# Patient Record
Sex: Male | Born: 1960 | Race: Black or African American | Hispanic: No | Marital: Single | Smoking: Current every day smoker
Health system: Southern US, Community
[De-identification: ages and names within clinical notes are randomized; demographics above are authoritative.]

## PROBLEM LIST (undated history)

## (undated) DIAGNOSIS — F1010 Alcohol abuse, uncomplicated: Secondary | ICD-10-CM

## (undated) DIAGNOSIS — F1290 Cannabis use, unspecified, uncomplicated: Secondary | ICD-10-CM

## (undated) DIAGNOSIS — I209 Angina pectoris, unspecified: Secondary | ICD-10-CM

## (undated) HISTORY — PX: ABDOMINAL SURGERY: SHX537

---

## 2011-08-27 ENCOUNTER — Emergency Department (HOSPITAL_COMMUNITY)
Admission: EM | Admit: 2011-08-27 | Discharge: 2011-08-28 | Disposition: A | Discharge status: Other Acute Inpatient Hospital | Payer: Self-pay | Attending: Emergency Medicine | Admitting: Emergency Medicine

## 2011-08-27 DIAGNOSIS — F1010 Alcohol abuse, uncomplicated: Secondary | ICD-10-CM | POA: Insufficient documentation

## 2011-08-27 LAB — COMPREHENSIVE METABOLIC PANEL
ALT: 21 U/L (ref 0–53)
AST: 29 U/L (ref 0–37)
CO2: 28 mEq/L (ref 19–32)
Calcium: 9.4 mg/dL (ref 8.4–10.5)
Chloride: 98 mEq/L (ref 96–112)
Creatinine, Ser: 1.04 mg/dL (ref 0.50–1.35)
GFR calc Af Amer: >60 mL/min (ref >60)
GFR calc non Af Amer: >60 mL/min (ref >60)
Glucose, Bld: 80 mg/dL (ref 70–99)
Sodium: 137 mEq/L (ref 135–145)
Total Bilirubin: 0.4 mg/dL (ref 0.3–1.2)

## 2011-08-27 LAB — CBC
Hemoglobin: 14.9 g/dL (ref 13.0–17.0)
MCH: 31.8 pg (ref 26.0–34.0)
MCV: 90.2 fL (ref 78.0–100.0)
Platelets: 332 10*3/uL (ref 150–400)
RBC: 4.68 MIL/uL (ref 4.22–5.81)
WBC: 8.5 10*3/uL (ref 4.0–10.5)

## 2011-08-27 LAB — DIFFERENTIAL
Eosinophils Absolute: 0.2 10*3/uL (ref 0.0–0.7)
Lymphs Abs: 2.1 10*3/uL (ref 0.7–4.0)
Monocytes Relative: 7 % (ref 3–12)
Neutro Abs: 5.5 10*3/uL (ref 1.7–7.7)
Neutrophils Relative %: 65 % (ref 43–77)

## 2011-08-27 LAB — URINALYSIS, ROUTINE W REFLEX MICROSCOPIC
Bilirubin Urine: NEGATIVE
Ketones, ur: NEGATIVE mg/dL
Leukocytes, UA: NEGATIVE
Nitrite: NEGATIVE
Protein, ur: NEGATIVE mg/dL
Urobilinogen, UA: 0.2 mg/dL (ref 0.0–1.0)

## 2011-08-27 LAB — RAPID URINE DRUG SCREEN, HOSP PERFORMED: Barbiturates: NOT DETECTED

## 2011-10-22 ENCOUNTER — Emergency Department (HOSPITAL_BASED_OUTPATIENT_CLINIC_OR_DEPARTMENT_OTHER)
Admission: EM | Admit: 2011-10-22 | Discharge: 2011-10-22 | Disposition: A | Discharge status: Home / Self Care | Payer: Self-pay | Attending: Emergency Medicine | Admitting: Emergency Medicine

## 2011-10-22 DIAGNOSIS — H60399 Other infective otitis externa, unspecified ear: Secondary | ICD-10-CM | POA: Insufficient documentation

## 2011-10-22 DIAGNOSIS — R21 Rash and other nonspecific skin eruption: Secondary | ICD-10-CM | POA: Insufficient documentation

## 2011-10-22 DIAGNOSIS — H6090 Unspecified otitis externa, unspecified ear: Secondary | ICD-10-CM

## 2011-10-22 DIAGNOSIS — F17200 Nicotine dependence, unspecified, uncomplicated: Secondary | ICD-10-CM | POA: Insufficient documentation

## 2011-10-22 DIAGNOSIS — L259 Unspecified contact dermatitis, unspecified cause: Secondary | ICD-10-CM | POA: Insufficient documentation

## 2011-10-22 MED ORDER — PREDNISONE (PAK) 10 MG PO TABS: 10.0000 mg | ORAL_TABLET | Freq: Every day | ORAL | Status: AC

## 2011-10-22 MED ORDER — NEOMYCIN-POLYMYXIN-HC 3.5-10000-1 OT SOLN
4.0000 [drp] | Freq: Four times a day (QID) | OTIC | Status: DC
  Administered 2011-10-22: 4 [drp] via OTIC
  Filled 2011-10-22: qty 10

## 2011-10-22 MED ORDER — NEOMYCIN-POLYMYXIN-DEXAMETH 3.5-10000-0.1 OP OINT: TOPICAL_OINTMENT | Freq: Four times a day (QID) | OPHTHALMIC | Status: DC

## 2011-10-22 NOTE — ED Notes (Signed)
Pt reports a rash on neck, face and arms.  Onset yesterday and no relief after taking benadryl.

## 2011-10-22 NOTE — ED Provider Notes (Signed)
History     CSN: [id number] Arrival date & time: 10/22/2011  2:03 PM   First MD Initiated Contact with Patient 10/22/11 1406      Chief Complaint  Patient presents with  . Rash    (Consider location/radiation/quality/duration/timing/severity/associated sxs/prior treatment) HPI Comments: Pt states that he thinks it started after he had shrimp:pt states that he had the rash on his elbow previously, but it is worse:pt states that he is also having some pain in his left ear  Patient is a 50 y.o. male presenting with rash. The history is provided by the patient. No language interpreter was used.  Rash  This is a new problem. The current episode started yesterday. The problem has not changed since onset.Associated with: shrimp. There has been no fever. The rash is present on the face, right arm and left arm. The pain is moderate. The pain has been constant since onset. Associated symptoms include itching. Pertinent negatives include no blisters and no pain. He has tried antihistamines for the symptoms. The treatment provided mild relief.    History reviewed. No pertinent past medical history.  Past Surgical History  Procedure Date  . Abdominal surgery     No family history on file.  History  Substance Use Topics  . Smoking status: Current Everyday Smoker -- 1.0 packs/day  . Smokeless tobacco: Not on file  . Alcohol Use: No      Review of Systems  Skin: Positive for itching and rash.  All other systems reviewed and are negative.    Allergies  Review of patient's allergies indicates no known allergies.  Home Medications   Current Outpatient Rx  Name Route Sig Dispense Refill  . DIPHENHYDRAMINE HCL (SLEEP) 25 MG PO TABS Oral Take 25 mg by mouth as needed.      . MULTIVITAMINS PO CAPS Oral Take 1 capsule by mouth daily.        BP 128/69  Pulse 79  Temp(Src) 98.4 F (36.9 C) (Oral)  Resp 16  Ht 5\' 10"  (1.778 m)  Wt 160 lb (72.576 kg)  BMI 22.96 kg/m2  SpO2  100%  Physical Exam  Nursing note and vitals reviewed. Constitutional: He appears well-developed and well-nourished.  HENT:  Head: Normocephalic and atraumatic.  Right Ear: External ear normal.  Ears:  Mouth/Throat: Oropharynx is clear and moist.       No swelling or oral involvement noted  Cardiovascular: Normal rate and regular rhythm.   Pulmonary/Chest: Effort normal and breath sounds normal.  Musculoskeletal: Normal range of motion.  Neurological: He is alert.  Skin:       Pt has a dry scaly rash to bilateral ac:pt has red raised papules to face    ED Course  Procedures (including critical care time)  Labs Reviewed - No data to display No results found.   1. Otitis externa   2. Contact dermatitis       MDM  Will treat with oral steroids:pt has what appears to be eczema in ac and then a contact derm on the face:no concern for steven johnson:will treat for otitis externa as well        Teressa Lower, NP 10/22/11 1428

## 2011-10-23 NOTE — ED Provider Notes (Signed)
Medical screening examination/treatment/procedure(s) were performed by non-physician practitioner and as supervising physician I was immediately available for consultation/collaboration.   Lillard Bailon E Millissa Deese, MD 10/23/11 1449 

## 2012-01-03 ENCOUNTER — Emergency Department (HOSPITAL_BASED_OUTPATIENT_CLINIC_OR_DEPARTMENT_OTHER)
Admission: EM | Admit: 2012-01-03 | Discharge: 2012-01-03 | Disposition: A | Discharge status: Home / Self Care | Payer: Self-pay | Attending: Emergency Medicine | Admitting: Emergency Medicine

## 2012-01-03 ENCOUNTER — Encounter (HOSPITAL_BASED_OUTPATIENT_CLINIC_OR_DEPARTMENT_OTHER): Payer: Self-pay | Admitting: *Deleted

## 2012-01-03 DIAGNOSIS — L259 Unspecified contact dermatitis, unspecified cause: Secondary | ICD-10-CM | POA: Insufficient documentation

## 2012-01-03 DIAGNOSIS — F17200 Nicotine dependence, unspecified, uncomplicated: Secondary | ICD-10-CM | POA: Insufficient documentation

## 2012-01-03 DIAGNOSIS — R21 Rash and other nonspecific skin eruption: Secondary | ICD-10-CM | POA: Insufficient documentation

## 2012-01-03 MED ORDER — DIPHENHYDRAMINE HCL 25 MG PO CAPS
ORAL_CAPSULE | ORAL | Status: AC
  Filled 2012-01-03: qty 1

## 2012-01-03 MED ORDER — DIPHENHYDRAMINE HCL 25 MG PO CAPS
25.0000 mg | ORAL_CAPSULE | Freq: Once | ORAL | Status: AC
  Administered 2012-01-03: 25 mg via ORAL
  Filled 2012-01-03: qty 1

## 2012-01-03 MED ORDER — PREDNISONE 10 MG PO TABS: ORAL_TABLET | ORAL | Status: DC

## 2012-01-03 NOTE — ED Notes (Signed)
Patient states he is using dove sensative skin for his body wash, and is using vaseline on his face for dry skin.  Encouraged patient to quit using the vaseline and to continue to use dove and aveeno lotion.

## 2012-01-03 NOTE — ED Provider Notes (Signed)
History     CSN: [id number]  Arrival date & time 01/03/12  1408   First MD Initiated Contact with Patient 01/03/12 1416      Chief Complaint  Patient presents with  . Rash    (Consider location/radiation/quality/duration/timing/severity/associated sxs/prior treatment) HPI Comments: Pt states that he had similar rash about 2 months ago and it resolved after some steriods, but he doesn't know what it was from last time or this time:pt states that he has been given benadryl at [facility] the last couple of days, but he still has the rash and itchiness  Patient is a 51 y.o. male presenting with rash. The history is provided by the patient. No language interpreter was used.  Rash  This is a new problem. The current episode started more than 2 days ago. The problem is associated with an unknown factor. There has been no fever. The rash is present on the face. Associated symptoms include itching. Pertinent negatives include no blisters, no pain and no weeping. He has tried antihistamines for the symptoms. The treatment provided mild relief.    History reviewed. No pertinent past medical history.  Past Surgical History  Procedure Date  . Abdominal surgery     No family history on file.  History  Substance Use Topics  . Smoking status: Current Everyday Smoker -- 1.0 packs/day  . Smokeless tobacco: Not on file  . Alcohol Use: No      Review of Systems  Skin: Positive for itching and rash.  All other systems reviewed and are negative.    Allergies  Review of patient's allergies indicates no known allergies.  Home Medications   Current Outpatient Rx  Name Route Sig Dispense Refill  . DIPHENHYDRAMINE HCL (SLEEP) 25 MG PO TABS Oral Take 25 mg by mouth as needed.      . MULTIVITAMINS PO CAPS Oral Take 1 capsule by mouth daily.        BP 123/77  Pulse 61  Temp(Src) 99 F (37.2 C) (Oral)  Resp 20  Ht 5' 10.5" (1.791 m)  Wt 160 lb (72.576 kg)  BMI 22.63 kg/m2  SpO2  100%  Physical Exam  Nursing note and vitals reviewed. Constitutional: He is oriented to person, place, and time. He appears well-developed and well-nourished.  HENT:  Right Ear: External ear normal.  Left Ear: External ear normal.  Mouth/Throat: Oropharynx is clear and moist.  Eyes: EOM are normal.  Neck: Neck supple.  Cardiovascular: Normal rate and regular rhythm.   Pulmonary/Chest: Effort normal and breath sounds normal.  Musculoskeletal: Normal range of motion.  Neurological: He is alert and oriented to person, place, and time.  Skin:       Pt has papules noted to face:pt also has dry scaly rash to the bilateral ac space  Psychiatric: He has a normal mood and affect.    ED Course  Procedures (including critical care time)  Labs Reviewed - No data to display No results found.   1. Contact dermatitis       MDM  Pt appears to have a contact dermatitis although uncertain of the cause:pt is not having any respiratory problems:pt has no oral involvement noted   Medical screening examination/treatment/procedure(s) were performed by non-physician practitioner and as supervising physician I was immediately available for consultation/collaboration. Osvaldo Human, M.D.     Teressa Lower, NP 01/03/12 1453  Carleene Cooper III, MD 01/03/12 2019

## 2012-01-03 NOTE — ED Notes (Signed)
Rash on his face 3 days. No resp distress.

## 2014-11-25 ENCOUNTER — Emergency Department (HOSPITAL_COMMUNITY): Payer: Self-pay

## 2014-11-25 ENCOUNTER — Emergency Department (HOSPITAL_COMMUNITY)
Admission: EM | Admit: 2014-11-25 | Discharge: 2014-11-25 | Disposition: A | Discharge status: Home / Self Care | Payer: Self-pay | Attending: Emergency Medicine | Admitting: Emergency Medicine

## 2014-11-25 ENCOUNTER — Encounter (HOSPITAL_COMMUNITY): Payer: Self-pay

## 2014-11-25 DIAGNOSIS — Y9289 Other specified places as the place of occurrence of the external cause: Secondary | ICD-10-CM | POA: Insufficient documentation

## 2014-11-25 DIAGNOSIS — Z79899 Other long term (current) drug therapy: Secondary | ICD-10-CM | POA: Insufficient documentation

## 2014-11-25 DIAGNOSIS — T1490XA Injury, unspecified, initial encounter: Secondary | ICD-10-CM

## 2014-11-25 DIAGNOSIS — IMO0002 Reserved for concepts with insufficient information to code with codable children: Secondary | ICD-10-CM

## 2014-11-25 DIAGNOSIS — Z23 Encounter for immunization: Secondary | ICD-10-CM | POA: Insufficient documentation

## 2014-11-25 DIAGNOSIS — S01112A Laceration without foreign body of left eyelid and periocular area, initial encounter: Secondary | ICD-10-CM | POA: Insufficient documentation

## 2014-11-25 DIAGNOSIS — S0101XA Laceration without foreign body of scalp, initial encounter: Secondary | ICD-10-CM | POA: Insufficient documentation

## 2014-11-25 DIAGNOSIS — Z720 Tobacco use: Secondary | ICD-10-CM | POA: Insufficient documentation

## 2014-11-25 DIAGNOSIS — Y9389 Activity, other specified: Secondary | ICD-10-CM | POA: Insufficient documentation

## 2014-11-25 DIAGNOSIS — Y998 Other external cause status: Secondary | ICD-10-CM | POA: Insufficient documentation

## 2014-11-25 MED ORDER — TETANUS-DIPHTH-ACELL PERTUSSIS 5-2.5-18.5 LF-MCG/0.5 IM SUSP
0.5000 mL | Freq: Once | INTRAMUSCULAR | Status: AC
  Administered 2014-11-25: 0.5 mL via INTRAMUSCULAR
  Filled 2014-11-25: qty 0.5

## 2014-11-25 NOTE — ED Notes (Signed)
Patient transported to CT 
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
## 2014-11-25 NOTE — ED Notes (Signed)
Back in room following CT. 
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
## 2014-12-17 ENCOUNTER — Ambulatory Visit: Payer: Self-pay

## 2016-08-30 ENCOUNTER — Encounter (HOSPITAL_COMMUNITY): Payer: Self-pay | Admitting: *Deleted

## 2016-08-30 ENCOUNTER — Emergency Department (HOSPITAL_COMMUNITY)
Admission: EM | Admit: 2016-08-30 | Discharge: 2016-08-30 | Disposition: A | Discharge status: Home / Self Care | Payer: Self-pay | Attending: Emergency Medicine | Admitting: Emergency Medicine

## 2016-08-30 ENCOUNTER — Emergency Department (HOSPITAL_COMMUNITY): Payer: Self-pay

## 2016-08-30 DIAGNOSIS — G8929 Other chronic pain: Secondary | ICD-10-CM | POA: Insufficient documentation

## 2016-08-30 DIAGNOSIS — M25511 Pain in right shoulder: Secondary | ICD-10-CM | POA: Insufficient documentation

## 2016-08-30 DIAGNOSIS — F17200 Nicotine dependence, unspecified, uncomplicated: Secondary | ICD-10-CM | POA: Insufficient documentation

## 2016-08-30 DIAGNOSIS — R0789 Other chest pain: Secondary | ICD-10-CM | POA: Insufficient documentation

## 2016-08-30 LAB — CBC
HCT: 37.9 % — ABNORMAL LOW (ref 39.0–52.0)
Hemoglobin: 12.4 g/dL — ABNORMAL LOW (ref 13.0–17.0)
MCH: 31.7 pg (ref 26.0–34.0)
MCHC: 32.7 g/dL (ref 30.0–36.0)
MCV: 96.9 fL (ref 78.0–100.0)
PLATELETS: 316 10*3/uL (ref 150–400)
RBC: 3.91 MIL/uL — AB (ref 4.22–5.81)
RDW: 15.5 % (ref 11.5–15.5)
WBC: 5.9 10*3/uL (ref 4.0–10.5)

## 2016-08-30 LAB — COMPREHENSIVE METABOLIC PANEL
ALBUMIN: 3.5 g/dL (ref 3.5–5.0)
ALT: 17 U/L (ref 17–63)
ANION GAP: 5 (ref 5–15)
AST: 27 U/L (ref 15–41)
Alkaline Phosphatase: 63 U/L (ref 38–126)
BILIRUBIN TOTAL: 0.3 mg/dL (ref 0.3–1.2)
BUN: 8 mg/dL (ref 6–20)
CO2: 29 mmol/L (ref 22–32)
Calcium: 8.6 mg/dL — ABNORMAL LOW (ref 8.9–10.3)
Chloride: 108 mmol/L (ref 101–111)
Creatinine, Ser: 0.92 mg/dL (ref 0.61–1.24)
GFR calc non Af Amer: >60 mL/min (ref >60)
GLUCOSE: 83 mg/dL (ref 65–99)
POTASSIUM: 4.3 mmol/L (ref 3.5–5.1)
SODIUM: 142 mmol/L (ref 135–145)
TOTAL PROTEIN: 6.4 g/dL — AB (ref 6.5–8.1)

## 2016-08-30 LAB — RAPID URINE DRUG SCREEN, HOSP PERFORMED
Amphetamines: NOT DETECTED
Barbiturates: NOT DETECTED
Benzodiazepines: NOT DETECTED
COCAINE: POSITIVE — AB
OPIATES: POSITIVE — AB
Tetrahydrocannabinol: NOT DETECTED

## 2016-08-30 LAB — I-STAT TROPONIN, ED
TROPONIN I, POC: 0 ng/mL (ref 0.00–0.08)
TROPONIN I, POC: 0 ng/mL (ref 0.00–0.08)

## 2016-08-30 LAB — LIPASE, BLOOD: LIPASE: 17 U/L (ref 11–51)

## 2016-08-30 MED ORDER — KETOROLAC TROMETHAMINE 30 MG/ML IJ SOLN
30.0000 mg | Freq: Once | INTRAMUSCULAR | Status: AC
  Administered 2016-08-30: 30 mg via INTRAVENOUS
  Filled 2016-08-30: qty 1

## 2016-08-30 MED ORDER — MELOXICAM 7.5 MG PO TABS: 7.5000 mg | ORAL_TABLET | Freq: Every day | ORAL | 0 refills | Status: DC | PRN

## 2016-08-30 MED ORDER — ASPIRIN 81 MG PO CHEW
324.0000 mg | CHEWABLE_TABLET | Freq: Once | ORAL | Status: DC
  Filled 2016-08-30: qty 4

## 2016-08-30 NOTE — ED Notes (Signed)
Informed pt we needed urine, pt unable to go at this time.

## 2016-08-30 NOTE — ED Provider Notes (Signed)
[facility] Provider Note   CSN: [id number] Arrival date & time: 08/30/16  4098     History   Chief Complaint Chief Complaint  Patient presents with  . Shoulder Pain  . Nausea  . Chest Pain    HPI Kenneth Leblanc is a 55 y.o. male.  HPI   Pt presents with sharp left sided chest pain that began yesterday around 8 or 9pm.  The pain is sharp, constant, no exacerbating or palliative symptoms.  Associated nausea.  Denies fevers, URI symptoms, change in chronic smoker's cough, hemoptysis, SOB, abdominal pain, any chest wall injury.  Denies recent immobilization, leg swelling, orthopnea.  Has also noted right arm pain and tingling that has been intermittent for at least one year, unrelated to the left chest pain.  Pt does not have a primary care provider.  Family hx of a brother who had an MI at an unknown age - this brother is currently 31 years old.  Pt does drink alcohol, few beers last night.  Denies drug use.    No past medical history on file.  There are no active problems to display for this patient.   Past Surgical History:  Procedure Laterality Date  . ABDOMINAL SURGERY         Home Medications    Prior to Admission medications   Medication Sig Start Date End Date Taking? Authorizing Provider  diphenhydrAMINE (BENADRYL) 25 MG tablet Take 50 mg by mouth every 6 (six) hours as needed for allergies.   Yes Historical Provider, MD  Multiple Vitamin (MULTIVITAMIN WITH MINERALS) TABS tablet Take 1 tablet by mouth daily.   Yes Historical Provider, MD    Family History No family history on file.  Social History Social History  Substance Use Topics  . Smoking status: Current Every Day Smoker    Packs/day: 1.00  . Smokeless tobacco: Never Used  . Alcohol use No     Allergies   Review of patient's allergies indicates no known allergies.   Review of Systems Review of Systems  All other systems reviewed and are negative.    Physical Exam Updated Vital  Signs BP 131/78   Pulse (!) 59   Temp 97.7 F (36.5 C) (Oral)   Resp 15   Ht 5\' 10"  (1.778 m)   Wt 63.5 kg   SpO2 98%   BMI 20.09 kg/m   Physical Exam  Constitutional: He appears well-developed and well-nourished. No distress.  HENT:  Head: Normocephalic and atraumatic.  Neck: Neck supple.  Cardiovascular: Normal rate and regular rhythm.   Pulmonary/Chest: Effort normal and breath sounds normal. No respiratory distress. He has no wheezes. He has no rales. He exhibits tenderness (left lateral chest wall tenderness ).  Abdominal: Soft. He exhibits no distension and no mass. There is tenderness (mild epigastric tenderness ). There is no rebound and no guarding.  Musculoskeletal:  Right shoulder with no erythema, edema, warmth. No focal tenderness.   No c-spine tenderness.  Distal pulses, sensation intact, grip strength is normal.    Neurological: He is alert. He exhibits normal muscle tone.  Skin: He is not diaphoretic.  Nursing note and vitals reviewed.    ED Treatments / Results  Labs (all labs ordered are listed, but only abnormal results are displayed) Labs Reviewed  CBC - Abnormal; Notable for the following:       Result Value   RBC 3.91 (*)    Hemoglobin 12.4 (*)    HCT 37.9 (*)  All other components within normal limits  COMPREHENSIVE METABOLIC PANEL - Abnormal; Notable for the following:    Calcium 8.6 (*)    Total Protein 6.4 (*)    All other components within normal limits  URINE RAPID DRUG SCREEN, HOSP PERFORMED - Abnormal; Notable for the following:    Opiates POSITIVE (*)    Cocaine POSITIVE (*)    All other components within normal limits  LIPASE, BLOOD  I-STAT TROPOININ, ED  I-STAT TROPOININ, ED    EKG  EKG Interpretation  Date/Time:  Thursday August 30 2016 07:23:58 EDT Ventricular Rate:  63 PR Interval:    QRS Duration: 92 QT Interval:  438 QTC Calculation: 449 R Axis:   82 Text Interpretation:  Sinus rhythm Probable anteroseptal  infarct, old no prior tracing for comparison Confirmed by [facility] Hospital[facility]ARD[facility] MD, PEDRO [phone]([id number]) on 08/30/2016 11:08:37 AM       Radiology Dg Chest 2 View  Result Date: 08/30/2016 CLINICAL DATA:  Chest pain. EXAM: CHEST  2 VIEW COMPARISON:  03/12/1995 report. FINDINGS: Mediastinum hilar structures normal. Cardiomegaly with normal pulmonary vascularity. Tiny calcified pulmonary nodules most likely granulomas. No focal pulmonary infiltrate. No pleural effusion or pneumothorax. IMPRESSION: 1.  Cardiomegaly.  No pulmonary venous congestion. 2. Tiny calcified pulmonary nodules most likely granulomas. No acute pulmonary disease. Electronically Signed   By: Maisie Fushomas  Register   On: 08/30/2016 08:31    Procedures Procedures (including critical care time)  Medications Ordered in ED Medications  aspirin chewable tablet 324 mg (324 mg Oral Not Given 08/30/16 0814)  ketorolac (TORADOL) 30 MG/ML injection 30 mg (30 mg Intravenous Given 08/30/16 1114)     Initial Impression / Assessment and Plan / ED Course  I have reviewed the triage vital signs and the nursing notes.  Pertinent labs & imaging results that were available during my care of the patient were reviewed by me and considered in my medical decision making (see chart for details).  Clinical Course    Afebrile, nontoxic patient with atypical chest pain that occurred last night.  No exacerbating factors or associated symptoms.  Pt also seen by Dr Eudelia Bunchardama.  Clinically doubt ACS.  Pt also has no known risk factor for PE.  Pt completely CP free in ED, complaining mostly of chronic unchanged right shoulder pain.  No e/o septic joint.  D/C home with PCP resources for follow up.  Advised to stop using cocaine.   Discussed result, findings, treatment, and follow up  with patient.  Pt given return precautions.  Pt verbalizes understanding and agrees with plan.       Final Clinical Impressions(s) / ED Diagnoses   Final diagnoses:  Atypical chest pain  Chronic right  shoulder pain    New Prescriptions New Prescriptions   No medications on file     [name], PA-C 08/30/16 1215

## 2016-08-30 NOTE — ED Provider Notes (Signed)
Medical screening examination/treatment/procedure(s) were conducted as a shared visit with non-physician practitioner(s) and myself.  I personally evaluated the patient during the encounter. Briefly, the patient is a 55 y.o. male who presents to the ED with sudden onset of left lateral sharp, stabbing pain that lasted 10 minutes. Has not recurred since onset. Currently asymptomatic. Patient denied any history of illicit drug use however UDS is positive. Presentation is highly inconsistent with ACS. Low pretest probability for pulmonary embolism. EKG nonspecific changes however without evidence of acute ischemia. Given the EKG changes we'll rule out ACS with serial troponins. Chest x-ray with cardiomegaly however without evidence suggestive of pneumonia, pneumothorax, pneumomediastinum.  No abnormal contour of the mediastinum to suggest dissection. No evidence of acute injuries.   Serial troponins negative X2.  The patient is safe for discharge with strict return precautions.    EKG Interpretation  Date/Time:  Thursday August 30 2016 07:23:58 EDT Ventricular Rate:  63 PR Interval:    QRS Duration: 92 QT Interval:  438 QTC Calculation: 449 R Axis:   82 Text Interpretation:  Sinus rhythm Probable anteroseptal infarct, old no prior tracing for comparison Confirmed by [facility]ARD[facility] MD, PEDRO ([id number]) on 08/30/2016 11:08:37 AM           Nira ConnPedro Eduardo Cardama, MD 08/31/16 [phone]

## 2016-08-30 NOTE — ED Notes (Signed)
Patient being transported to x-ray by Tami Ribasudy Suits.

## 2016-08-30 NOTE — Discharge Instructions (Signed)
Read the information below.  You may return to the Emergency Department at any time for worsening condition or any new symptoms that concern you.   If you develop worsening chest pain, shortness of breath, fever, you pass out, or become weak or dizzy, return to the ER for a recheck.    °

## 2016-08-30 NOTE — ED Triage Notes (Signed)
Patient comes in with c/o chest pain, right shoulder pain, and nausea. Patient states the nausea started yesterday. No vomiting. LBM today. Chest pain started this AM when he was sleeping on the left side of chest. Patient states, "something just aint right." Patient states its never felt like this. Nothing makes the pain better or worse. Hx of smoking, 0.5-1 pack/day.

## 2016-09-03 ENCOUNTER — Emergency Department (HOSPITAL_COMMUNITY)
Admission: EM | Admit: 2016-09-03 | Discharge: 2016-09-03 | Disposition: A | Discharge status: Home / Self Care | Payer: Self-pay | Attending: Emergency Medicine | Admitting: Emergency Medicine

## 2016-09-03 ENCOUNTER — Encounter (HOSPITAL_COMMUNITY): Payer: Self-pay | Admitting: Emergency Medicine

## 2016-09-03 DIAGNOSIS — F1020 Alcohol dependence, uncomplicated: Secondary | ICD-10-CM | POA: Insufficient documentation

## 2016-09-03 DIAGNOSIS — F17200 Nicotine dependence, unspecified, uncomplicated: Secondary | ICD-10-CM | POA: Insufficient documentation

## 2016-09-03 LAB — COMPREHENSIVE METABOLIC PANEL
ALT: 31 U/L (ref 17–63)
ANION GAP: 15 (ref 5–15)
AST: 46 U/L — AB (ref 15–41)
Albumin: 4.1 g/dL (ref 3.5–5.0)
Alkaline Phosphatase: 72 U/L (ref 38–126)
BILIRUBIN TOTAL: 0.7 mg/dL (ref 0.3–1.2)
BUN: 7 mg/dL (ref 6–20)
CO2: 23 mmol/L (ref 22–32)
Calcium: 9.4 mg/dL (ref 8.9–10.3)
Chloride: 100 mmol/L — ABNORMAL LOW (ref 101–111)
Creatinine, Ser: 0.83 mg/dL (ref 0.61–1.24)
GFR calc non Af Amer: >60 mL/min (ref >60)
GLUCOSE: 64 mg/dL — AB (ref 65–99)
Potassium: 4.7 mmol/L (ref 3.5–5.1)
Sodium: 138 mmol/L (ref 135–145)
TOTAL PROTEIN: 7.5 g/dL (ref 6.5–8.1)

## 2016-09-03 LAB — CBC
HCT: 41.3 % (ref 39.0–52.0)
HEMOGLOBIN: 13.8 g/dL (ref 13.0–17.0)
MCH: 32.2 pg (ref 26.0–34.0)
MCHC: 33.4 g/dL (ref 30.0–36.0)
MCV: 96.3 fL (ref 78.0–100.0)
Platelets: 360 10*3/uL (ref 150–400)
RBC: 4.29 MIL/uL (ref 4.22–5.81)
RDW: 15.3 % (ref 11.5–15.5)
WBC: 9 10*3/uL (ref 4.0–10.5)

## 2016-09-03 LAB — RAPID URINE DRUG SCREEN, HOSP PERFORMED
AMPHETAMINES: NOT DETECTED
BENZODIAZEPINES: NOT DETECTED
Barbiturates: NOT DETECTED
Cocaine: NOT DETECTED
OPIATES: POSITIVE — AB
TETRAHYDROCANNABINOL: NOT DETECTED

## 2016-09-03 LAB — ETHANOL: Alcohol, Ethyl (B): 164 mg/dL — ABNORMAL HIGH (ref <5)

## 2016-09-03 MED ORDER — ADULT MULTIVITAMIN W/MINERALS CH
1.0000 | ORAL_TABLET | Freq: Every day | ORAL | Status: DC
  Administered 2016-09-03: 1 via ORAL
  Filled 2016-09-03: qty 1

## 2016-09-03 MED ORDER — ONDANSETRON 4 MG PO TBDP: 4.0000 mg | ORAL_TABLET | Freq: Three times a day (TID) | ORAL | 0 refills | Status: DC | PRN

## 2016-09-03 MED ORDER — THIAMINE HCL 100 MG/ML IJ SOLN
100.0000 mg | Freq: Once | INTRAMUSCULAR | Status: AC
  Administered 2016-09-03: 100 mg via INTRAMUSCULAR
  Filled 2016-09-03: qty 2

## 2016-09-03 MED ORDER — CHLORDIAZEPOXIDE HCL 25 MG PO CAPS: 50.0000 mg | ORAL_CAPSULE | Freq: Three times a day (TID) | ORAL | 0 refills | Status: AC | PRN

## 2016-09-03 MED ORDER — VITAMIN B-1 100 MG PO TABS: 100.0000 mg | ORAL_TABLET | Freq: Every day | ORAL | 0 refills | Status: AC

## 2016-09-03 MED ORDER — LOPERAMIDE HCL 2 MG PO CAPS: 2.0000 mg | ORAL_CAPSULE | Freq: Four times a day (QID) | ORAL | 0 refills | Status: DC | PRN

## 2016-09-03 MED ORDER — CHLORDIAZEPOXIDE HCL 5 MG PO CAPS: 25.0000 mg | ORAL_CAPSULE | Freq: Four times a day (QID) | ORAL | Status: DC | PRN

## 2016-09-03 NOTE — ED Triage Notes (Signed)
Pt present to ED for detox from alcohol. Pt reports "drinking multiple "40's" everyday. Pt states here in eD to "get help stopping drinking"

## 2016-09-03 NOTE — ED Notes (Signed)
Patient states last drink was at 0700 states he drank 40oz. States he drinks daily. Patient denies any SI/HI

## 2016-09-03 NOTE — Discharge Instructions (Signed)
Substance Abuse Treatment Programs ° °Intensive Outpatient Programs °[facility]     °[address]      °[place], [place]                   °[phone]      ° °[facility] °[address] °[place], [place] °[phone] ° °[facility]     °(Inpatient and outpatient)     °[address]           °[phone]   ° °[facility] °[phone] (Suboxone and Methadone) ° °[address]      °[place], [place] [address]      °[phone]      ° °[address] °[place], [place] °[phone] ° °[facility] (Outpatient/Inpatient, Chemical)    °(insurance only) [phone]      °       °[facility]Groups & Residential) °[place], [place] °[phone] ° °   °[facility]     °[address]     °[place], [place]      °[phone]      ° °[facility] (for caregivers and family) °[address] °[place], [place] °336-299-4655 ° ° ° ° ° °Residential Treatment Programs °[facility]      °[address], [place], [place] [address]  °[phone]      ° °[facility] °[address] [place], [place] [address] °[phone] ° °[facility]        °336-248-8914      ° °[facility] °[phone] ° °[facility]             °[address]                                         °[place], [place]                                                °877-615-2722 or 336-784-9470                              ° °[facility] [place] °[address] °[place], [address] °[phone] ° °[facility]    °[address]      °[place], [place]     °[phone]      ° °[facility] °[address] °[place], [place] °336-285-9073 ° °[facility]   °[address]     °[place], [place] [address]     °[phone]      °Admissions: 8am-3pm M-F ° °[facility] °[address] °[place],  [place] °336-227-7417 ° °[facility]: Residential Program (90 Days)   °[place], [place]      °336-725-8389 or 800-758-6077    ° °[facility] °[place], [place] °(Walk in Hours over the weekend or by referral) ° °[facility] °[address], [place],  [address] °([phone] ° °Crisis Mobile: [facility]:  1-877-626-1772 (for crisis response 24 hours a day) °[facility] Hotline:      1-800-256-2452 °Outpatient Psychiatry and Counseling ° °[facility]: Mobile Crisis   Management 24 hours:  1-877-626-1772 ° °[facility] sliding scale fee and walk in schedule: M-F 8am-12pm/1pm-3pm °[address]  °[place], [place] [address] °[phone] ° °[facility] °[address] °[place], [place] [address] °336-703-9650 ° °[facility] ([facility])- new patient walk-in appointments available Monday - Friday 8am -3pm.          °[address] °[place], [place] [address] °336-676-6840 or crisis line- 336-676-6905 ° °[facility] Intensive Outpatient Therapy Program °[address] °[place], [place] [address] °336-832-9804 ° °[facility]                  °Crisis Services      °[phone]      °[address]     °[place], [place] [address]                ° °[facility]   °[facility] °[phone] °[address] °[place], [place] [address] ° ° °[facility]          °[address]  °[place], [place] [address]       °[phone] ° °[facility] °[address] °[place], [place] [address] °336-292-1510 ° °[facility]    °[address]    °[place], [place] [address]     °[phone]      ° °Parish McKinney, MD     °[address]     °[place] [address]     °336-282-1251     °  °[facility] °[address] °[place] [address] ° °[facility]     °[address]     °[place], [place]      °336-542-2076      ° °[facility] °Shamsher Ahluwalia, MD °[address] °[place], [place] [address] °[phone] ° °[facility]     °[address]     °[place], [place] [address]     °336-274-1237      ° °[facility] °[address] °[place], [place] [address] °336-854-4450 °Resources for Temporary Residential Assistance/Crisis Centers ° °DAY CENTERS °[facility] °M-F 8am-3pm   °[address] [place], [place] [address]   336-332-0824 °Services include: laundry, barbering, support groups, case management, phone  & computer access, showers, AA/NA mtgs, mental health/substance abuse nurse, job skills class, disability information, VA assistance, spiritual classes, etc.  ° °HOMELESS SHELTERS ° °[facility]     °[facility]   °[address], [place]     °[phone]       °       °[facility] (women and children)       °[address] °[place], [place] [address] °336-275-0820 °[email or web address] for application and process °Application Required ° °[facility]   °[address]    °[place] [address]     °[phone]       °             °[facility] °[address] °[place], [place] [address] °336.273.5572 °336-235-0363(schedule application appt.) °Application Required ° °[facility] (women only)    °[address]     °[place], [place] [address]     °336-884-1039      °  Intake starts 6pm daily °Need valid ID, SSC, & Police report °[facility] [place] °[address] °[place], [place] °336-881-5420 °Application Required ° °[facility] (men only)     °[address]      °[place], [place]     °[phone]      ° °[facility] °(Pregnant women only) °[address] °[place], [place] °336-275-0206 ° °[facility]      °[address]      °[place], [place] [address]     °[phone]      °       °[facility] °[address] °[place], [place] °336-723-1848 °90 day commitment/SA/Application process ° °[facility](men only)     °[address]     °[place], [place]     °[phone]       °Check-in at 7pm     °       °[facility] °[address] °[place], [place] [address] °336-248-6684 °Men/Women/Women and Children must be there by 7 pm ° °[facility] °[place], [place] °336-722-8721                ° °

## 2016-09-03 NOTE — ED Provider Notes (Signed)
[facility] Provider Note   CSN: [id number] Arrival date & time: 09/03/16  1009    History   Chief Complaint Chief Complaint  Patient presents with  . Alcohol Problem    HPI Kenneth Leblanc is a 55 y.o. male.  HPI   Pt is a 55 y/o AAM, presents to the ER with desire to be admitted for alcohol detox program.  Pt states that daily he drinks alcohol, he is unable to specify amount, but states that he "drinks until I fall asleep."  This morning he was drinking a 40, and he threw it away and desired to stop drinking.  He began this am at 57m, nearly 9 hours ago.  He has hx of tobacco use, he denies any other illegal drugs. He denies any confusion, tremor, chills, sweats, nausea, vomiting, diarrhea, abdominal pain, chest pain or shortness of breath. He has had no syncopal episodes, falls or injury.  He reports being in a detox or inpatient treatment 5-6 years ago. He currently denies SI, HI, AVH.    History reviewed. No pertinent past medical history.  There are no active problems to display for this patient.   Past Surgical History:  Procedure Laterality Date  . ABDOMINAL SURGERY         Home Medications    Prior to Admission medications   Medication Sig Start Date End Date Taking? Authorizing Provider  diphenhydrAMINE (BENADRYL) 25 MG tablet Take 50 mg by mouth every 6 (six) hours as needed for allergies.   Yes Historical Provider, MD  Multiple Vitamin (MULTIVITAMIN WITH MINERALS) TABS tablet Take 1 tablet by mouth daily.   Yes Historical Provider, MD  chlordiazePOXIDE (LIBRIUM) 25 MG capsule Take 2 capsules (50 mg total) by mouth 3 (three) times daily as needed for anxiety. 50mg  by mouth q 6 h day 1 50mg  by mouth q 8 h day 2 50mg  by mouth q 12 h day 3 50mg  by mough QHS day 4 09/03/16   Danelle Berry, PA-C  loperamide (IMODIUM) 2 MG capsule Take 1 capsule (2 mg total) by mouth 4 (four) times daily as needed for diarrhea or loose stools. 09/03/16   Danelle Berry, PA-C    meloxicam (MOBIC) 7.5 MG tablet Take 1 tablet (7.5 mg total) by mouth daily as needed for pain. 08/30/16   Trixie Dredge, PA-C  ondansetron (ZOFRAN ODT) 4 MG disintegrating tablet Take 1 tablet (4 mg total) by mouth every 8 (eight) hours as needed for nausea or vomiting. 09/03/16   Danelle Berry, PA-C  thiamine (VITAMIN B-1) 100 MG tablet Take 1 tablet (100 mg total) by mouth daily. 09/03/16   Danelle Berry, PA-C    Family History No family history on file.  Social History Social History  Substance Use Topics  . Smoking status: Current Every Day Smoker    Packs/day: 1.00  . Smokeless tobacco: Never Used  . Alcohol use No     Allergies   Review of patient's allergies indicates no known allergies.   Review of Systems Review of Systems  All other systems reviewed and are negative.    Physical Exam Updated Vital Signs BP 128/80   Pulse (!) 59   Temp 97.9 F (36.6 C) (Oral)   Resp 17   Ht 5\' 10"  (1.778 m)   Wt 62.1 kg   SpO2 98%   BMI 19.66 kg/m   Physical Exam  Constitutional: He is oriented to person, place, and time. He appears well-developed and well-nourished. No distress.  HENT:  Head: Normocephalic and atraumatic.  Right Ear: External ear normal.  Left Ear: External ear normal.  Nose: Nose normal.  Mouth/Throat: Oropharynx is clear and moist. No oropharyngeal exudate.  Eyes: Conjunctivae and EOM are normal. Pupils are equal, round, and reactive to light. Right eye exhibits no discharge. Left eye exhibits no discharge. No scleral icterus.  Neck: Normal range of motion. Neck supple. No JVD present. No tracheal deviation present.  Cardiovascular: Normal rate, regular rhythm, normal heart sounds and intact distal pulses.  Exam reveals no gallop and no friction rub.   No murmur heard. Regular rate and rhythm, no murmur, gallop or rub, no lower extremity edema, symmetrical peripheral pulses, radial 2+, dorsal pedis 2+  Pulmonary/Chest: Effort normal and breath sounds normal.  No stridor. No respiratory distress. He has no wheezes. He has no rales. He exhibits no tenderness.  Abdominal: Soft. Bowel sounds are normal. He exhibits no distension and no mass. There is no tenderness. There is no rebound and no guarding.  Thin abdomen, normal appearance, nondistended, nontender, bowel sounds 4, no ascites, no tenderness to palpation  Musculoskeletal: Normal range of motion. He exhibits no edema.  Neurological: He is alert and oriented to person, place, and time. He exhibits normal muscle tone. Coordination normal.  Skin: Skin is warm and dry. Capillary refill takes less than 2 seconds. No rash noted. He is not diaphoretic. No erythema. No pallor.  Psychiatric: He has a normal mood and affect. His behavior is normal. Judgment and thought content normal.  Nursing note and vitals reviewed.    ED Treatments / Results  Labs (all labs ordered are listed, but only abnormal results are displayed) Labs Reviewed  COMPREHENSIVE METABOLIC PANEL - Abnormal; Notable for the following:       Result Value   Chloride 100 (*)    Glucose, Bld 64 (*)    AST 46 (*)    All other components within normal limits  ETHANOL - Abnormal; Notable for the following:    Alcohol, Ethyl (B) 164 (*)    All other components within normal limits  URINE RAPID DRUG SCREEN, HOSP PERFORMED - Abnormal; Notable for the following:    Opiates POSITIVE (*)    All other components within normal limits  CBC  AMMONIA  ACETAMINOPHEN LEVEL    EKG  EKG Interpretation None       Radiology No results found.  Procedures Procedures (including critical care time)  Medications Ordered in ED Medications  multivitamin with minerals tablet 1 tablet (1 tablet Oral Given 09/03/16 1549)  chlordiazePOXIDE (LIBRIUM) capsule 25 mg (not administered)  thiamine (B-1) injection 100 mg (100 mg Intramuscular Given 09/03/16 1549)     Initial Impression / Assessment and Plan / ED Course  I have reviewed the triage  vital signs and the nursing notes.  Pertinent labs & imaging results that were available during my care of the patient were reviewed by me and considered in my medical decision making (see chart for details).  Clinical Course   Pt requesting alcohol withdrawal, was drinking this am, wishes to stop.  Pt does not appear to be in withdrawal, speech clear, no tremor, A&O x4.  Pt hemodynamically stable.  Given resources for outpatient tx. Vitals:   09/03/16 1515 09/03/16 1545 09/03/16 1600 09/03/16 1615  BP: 119/65 123/76 123/82 128/80  Pulse: 70 (!) 58 60 (!) 59  Resp: 17 16 16 17   Temp:      TempSrc:      SpO2: 96%  98% 98% 98%  Weight:      Height:         Final Clinical Impressions(s) / ED Diagnoses   Final diagnoses:  Uncomplicated alcohol dependence (HCC)    New Prescriptions Discharge Medication List as of 09/03/2016  4:05 PM    START taking these medications   Details  chlordiazePOXIDE (LIBRIUM) 25 MG capsule Take 2 capsules (50 mg total) by mouth 3 (three) times daily as needed for anxiety. 50mg  by mouth q 6 h day 1 50mg  by mouth q 8 h day 2 50mg  by mouth q 12 h day 3 50mg  by mough QHS day 4, Starting Mon 09/03/2016, Print    thiamine (VITAMIN B-1) 100 MG tablet Take 1 tablet (100 mg total) by mouth daily., Starting Mon 09/03/2016, Print         Danelle Berry, PA-C 09/19/16 1610    Margarita Grizzle, MD 09/23/16 [phone]

## 2016-10-25 ENCOUNTER — Emergency Department (HOSPITAL_COMMUNITY): Payer: Self-pay

## 2016-10-25 ENCOUNTER — Encounter (HOSPITAL_COMMUNITY): Payer: Self-pay | Admitting: Emergency Medicine

## 2016-10-25 ENCOUNTER — Inpatient Hospital Stay (HOSPITAL_COMMUNITY)
Admission: EM | Admit: 2016-10-25 | Discharge: 2016-10-26 | DRG: 918 | Disposition: A | Discharge status: Home / Self Care | Payer: Self-pay | Attending: Internal Medicine | Admitting: Internal Medicine

## 2016-10-25 DIAGNOSIS — I200 Unstable angina: Secondary | ICD-10-CM

## 2016-10-25 DIAGNOSIS — R079 Chest pain, unspecified: Secondary | ICD-10-CM

## 2016-10-25 DIAGNOSIS — I201 Angina pectoris with documented spasm: Secondary | ICD-10-CM | POA: Diagnosis present

## 2016-10-25 DIAGNOSIS — I249 Acute ischemic heart disease, unspecified: Secondary | ICD-10-CM

## 2016-10-25 DIAGNOSIS — Z801 Family history of malignant neoplasm of trachea, bronchus and lung: Secondary | ICD-10-CM

## 2016-10-25 DIAGNOSIS — T405X1A Poisoning by cocaine, accidental (unintentional), initial encounter: Principal | ICD-10-CM | POA: Diagnosis present

## 2016-10-25 DIAGNOSIS — F1290 Cannabis use, unspecified, uncomplicated: Secondary | ICD-10-CM | POA: Diagnosis present

## 2016-10-25 DIAGNOSIS — Z720 Tobacco use: Secondary | ICD-10-CM

## 2016-10-25 DIAGNOSIS — F17210 Nicotine dependence, cigarettes, uncomplicated: Secondary | ICD-10-CM | POA: Diagnosis present

## 2016-10-25 HISTORY — DX: Angina pectoris, unspecified: I20.9

## 2016-10-25 LAB — PROTIME-INR
INR: 0.97
Prothrombin Time: 12.8 seconds (ref 11.4–15.2)

## 2016-10-25 LAB — LIPID PANEL
CHOL/HDL RATIO: 1.9 ratio
CHOLESTEROL: 216 mg/dL — AB (ref 0–200)
HDL: 112 mg/dL (ref >40)
LDL Cholesterol: 91 mg/dL (ref 0–99)
TRIGLYCERIDES: 67 mg/dL (ref <150)
VLDL: 13 mg/dL (ref 0–40)

## 2016-10-25 LAB — COMPREHENSIVE METABOLIC PANEL
ALT: 39 U/L (ref 17–63)
AST: 81 U/L — ABNORMAL HIGH (ref 15–41)
Albumin: 4.1 g/dL (ref 3.5–5.0)
Alkaline Phosphatase: 67 U/L (ref 38–126)
Anion gap: 12 (ref 5–15)
BUN: 20 mg/dL (ref 6–20)
CHLORIDE: 102 mmol/L (ref 101–111)
CO2: 23 mmol/L (ref 22–32)
Calcium: 9.4 mg/dL (ref 8.9–10.3)
Creatinine, Ser: 1.07 mg/dL (ref 0.61–1.24)
Glucose, Bld: 91 mg/dL (ref 65–99)
POTASSIUM: 4.3 mmol/L (ref 3.5–5.1)
SODIUM: 137 mmol/L (ref 135–145)
Total Bilirubin: 0.4 mg/dL (ref 0.3–1.2)
Total Protein: 7.4 g/dL (ref 6.5–8.1)

## 2016-10-25 LAB — I-STAT TROPONIN, ED: Troponin i, poc: 0 ng/mL (ref 0.00–0.08)

## 2016-10-25 LAB — CBC
HEMATOCRIT: 39.8 % (ref 39.0–52.0)
HEMOGLOBIN: 13.4 g/dL (ref 13.0–17.0)
MCH: 32.1 pg (ref 26.0–34.0)
MCHC: 33.7 g/dL (ref 30.0–36.0)
MCV: 95.2 fL (ref 78.0–100.0)
Platelets: 311 10*3/uL (ref 150–400)
RBC: 4.18 MIL/uL — AB (ref 4.22–5.81)
RDW: 15 % (ref 11.5–15.5)
WBC: 10 10*3/uL (ref 4.0–10.5)

## 2016-10-25 LAB — TROPONIN I: Troponin I: <0.03 ng/mL (ref <0.03)

## 2016-10-25 LAB — APTT: APTT: 23 s — AB (ref 24–36)

## 2016-10-25 MED ORDER — SODIUM CHLORIDE 0.9 % IV SOLN
10.0000 mL/h | INTRAVENOUS | Status: DC
  Administered 2016-10-25: 10 mL/h via INTRAVENOUS

## 2016-10-25 MED ORDER — NITROGLYCERIN PEDIATRIC IV INFUSION 200 MCG/ML: 5.0000 ug/min | INTRAVENOUS | Status: DC

## 2016-10-25 MED ORDER — HEPARIN SODIUM (PORCINE) 5000 UNIT/ML IJ SOLN
INTRAMUSCULAR | Status: AC
  Filled 2016-10-25: qty 1

## 2016-10-25 MED ORDER — HEPARIN BOLUS VIA INFUSION
3500.0000 [IU] | Freq: Once | INTRAVENOUS | Status: AC
  Administered 2016-10-25: 3500 [IU] via INTRAVENOUS
  Filled 2016-10-25: qty 3500

## 2016-10-25 MED ORDER — ASPIRIN 81 MG PO CHEW
324.0000 mg | CHEWABLE_TABLET | Freq: Once | ORAL | Status: AC
  Administered 2016-10-25: 324 mg via ORAL

## 2016-10-25 MED ORDER — HEPARIN (PORCINE) IN NACL 100-0.45 UNIT/ML-% IJ SOLN
850.0000 [IU]/h | INTRAMUSCULAR | Status: DC
  Administered 2016-10-25: 750 [IU]/h via INTRAVENOUS
  Filled 2016-10-25: qty 250

## 2016-10-25 MED ORDER — NITROGLYCERIN IN D5W 200-5 MCG/ML-% IV SOLN: 0.0000 ug/min | INTRAVENOUS | Status: DC

## 2016-10-25 MED ORDER — ASPIRIN 81 MG PO CHEW
CHEWABLE_TABLET | ORAL | Status: AC
  Filled 2016-10-25: qty 1

## 2016-10-25 NOTE — H&P (Addendum)
CARDIOLOGY H&P  PCP: none Cardiology: New   HPI:  55 y/o male with no known PMHx except for tobacco use presents to ER with CP and ST elevation.   Denies any previous cardiac history. Says for past few weeks has had 3-4 episodes of brief sharp left-sided CP come on without warning.Lasts 30 seconds an resolves. Tonight went to smoke a cigarette and had recurrent pain without associated symptoms.   Initial ECG in ER with 8mm ST elevatino in v3 and about 3mm ST elevation in V4. Pain quickly resolved without intervention and repeat ECG completely normal.   Adamantly denies cocaine use but UDS in computer from 9/17 + for cocaine. Does admit to occasional THC.   POC trop negative   Review of Systems:     Cardiac Review of Systems: {Y] = yes [ ]  = no  Chest Pain [place].Etienne    ]  Resting SOB [   ] Exertional SOB  [  ]  Orthopnea [  ]   Pedal Edema [   ]    Palpitations [  ] Syncope  [  ]   Presyncope [   ]  General Review of Systems: [Y] = yes [  ]=no Constitional: recent weight change [  ]; anorexia [  ]; fatigue [  ]; nausea [  ]; night sweats [  ]; fever [  ]; or chills [  ];                                                                     Dental: poor dentition[  ];   Eye : blurred vision [  ]; diplopia [   ]; vision changes [  ];  Amaurosis fugax[  ]; Resp: cough [  ];  wheezing[  ];  hemoptysis[  ]; shortness of breath[  ]; paroxysmal nocturnal dyspnea[  ]; dyspnea on exertion[  ]; or orthopnea[  ];  GI:  gallstones[  ], vomiting[  ];  dysphagia[  ]; melena[  ];  hematochezia [  ]; heartburn[  ];   GU: kidney stones [  ]; hematuria[  ];   dysuria [  ];  nocturia[  ];               Skin: rash [  ], swelling[  ];, hair loss[  ];  peripheral edema[  ];  or itching[  ]; Musculosketetal: myalgias[  ];  joint swelling[  ];  joint erythema[  ];  joint pain[  ];  back pain[  ];  Heme/Lymph: bruising[  ];  bleeding[  ];  anemia[  ];  Neuro: TIA[  ];  headaches[  ];  stroke[  ];  vertigo[  ];   seizures[  ];   paresthesias[  ];  difficulty walking[  ];  Psych:depression[  ]; anxiety[  ];  Endocrine: diabetes[  ];  thyroid dysfunction[  ];  Other:  PMHX:  1. Tobacco use 1ppd x many years   Home meds: None   No Known Allergies  Social History   Social History  . Marital status: Single    Spouse name: N/A  . Number of children: N/A  . Years of education: N/A   Occupational History  . Not on  file.   Social History Main Topics  . Smoking status: Current Every Day Smoker    Packs/day: 1.00  . Smokeless tobacco: Never Used  . Alcohol use 3.6 oz/week    6 Cans of beer per week     Comment: current every day drinker  . Drug use: No  . Sexual activity: Not on file   Other Topics Concern  . Not on file   Social History Narrative  . 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 + tobacco use  Family Hx:  Mom alive and well Dad died of lung CA Brother died of MI in his 4850s  PHYSICAL EXAM: Vitals:   10/25/16 2056 10/25/16 2100  BP: 127/79 124/78  Pulse: 81   Resp: 10 13  Temp:     General:  Thin Well appearing. No respiratory difficulty HEENT: normal x for poor dentition Neck: supple. no JVD. Carotids 2+ bilat; no bruits. No lymphadenopathy or thryomegaly appreciated. Cor: PMI nondisplaced. Regular rate & rhythm. No rubs, gallops or murmurs. Lungs: clear Abdomen: soft, nontender, nondistended. No hepatosplenomegaly. No bruits or masses. Good bowel sounds. Extremities: no cyanosis, clubbing, rash, edema Neuro: alert & oriented x 3, cranial nerves grossly intact. moves all 4 extremities w/o difficulty. Affect pleasant.  ECG: as per HPI  Results for orders placed or performed during the hospital encounter of 10/25/16 (from the past 24 hour(s))  CBC     Status: Abnormal   Collection Time: 10/25/16  8:46 PM  Result Value Ref Range   WBC 10.0 4.0 - 10.5 K/uL   RBC 4.18 (L) 4.22 - 5.81 MIL/uL   Hemoglobin 13.4 13.0 - 17.0 g/dL   HCT  40.939.8 81.139.0 - 91.452.0 %   MCV 95.2 78.0 - 100.0 fL   MCH 32.1 26.0 - 34.0 pg   MCHC 33.7 30.0 - 36.0 g/dL   RDW 78.215.0 95.611.5 - 21.315.5 %   Platelets 311 150 - 400 K/uL  Protime-INR     Status: None   Collection Time: 10/25/16  8:46 PM  Result Value Ref Range   Prothrombin Time 12.8 11.4 - 15.2 seconds   INR 0.97   APTT     Status: Abnormal   Collection Time: 10/25/16  8:46 PM  Result Value Ref Range   aPTT 23 (L) 24 - 36 seconds  I-stat troponin, ED     Status: None   Collection Time: 10/25/16  8:53 PM  Result Value Ref Range   Troponin i, poc 0.00 0.00 - 0.08 ng/mL   Comment 3           Dg Chest Port 1 View  Result Date: 10/25/2016 CLINICAL DATA:  Severe left-sided chest pain tonight while smoking cigarettes. Current smoker. EXAM: PORTABLE CHEST 1 VIEW COMPARISON:  08/30/2016 FINDINGS: The heart size and mediastinal contours are within normal limits. Both lungs are clear. The visualized skeletal structures are unremarkable. IMPRESSION: No active disease. Electronically Signed   By: Burman NievesWilliam  Stevens M.D.   On: 10/25/2016 21:20     ASSESSMENT/PLAN: 1. CP/ACS with rapidly resolving ST elevation anteriorly    -aborted STEMI vs spasm    -case discussed with on-cal interventionalist, Dr. Allyson SabalBerry. Given rapid resolution of ST segments, normal troponin and transient nature of symptoms. Code Stemi called off    --treat with ASA, heparin, IV NTG. Cycle CEs    --plan cath in am    --avoid b-blocker with potential of spasm    --check UDS  2. Tobacco use    -counseled on need to quit.   Celinda Dethlefs,MD 9:40 PM

## 2016-10-25 NOTE — ED Provider Notes (Signed)
[facility]-EMERGENCY DEPT Provider Note   CSN: [id number] Arrival date & time: 10/25/16  2031     History   Chief Complaint Chief Complaint  Patient presents with  . Chest Pain    HPI Kenneth Leblanc is a 55 y.o. male.   Chest Pain   This is a new problem. The current episode started less than 1 hour ago. The problem occurs rarely. The problem has been resolved. The pain is associated with rest. The pain is present in the lateral region. The pain is moderate. The quality of the pain is described as brief and stabbing. The pain does not radiate. Pertinent negatives include no abdominal pain, no back pain, no cough, no diaphoresis, no fever, no nausea, no palpitations, no shortness of breath and no vomiting. He has tried nothing for the symptoms. The treatment provided significant relief. Risk factors include alcohol intake, smoking/tobacco exposure and substance abuse.  Pertinent negatives for past medical history include no seizures.    History reviewed. No pertinent past medical history.  There are no active problems to display for this patient.   Past Surgical History:  Procedure Laterality Date  . ABDOMINAL SURGERY         Home Medications    Prior to Admission medications   Medication Sig Start Date End Date Taking? Authorizing Provider  chlordiazePOXIDE (LIBRIUM) 25 MG capsule Take 2 capsules (50 mg total) by mouth 3 (three) times daily as needed for anxiety. 50mg  by mouth q 6 h day 1 50mg  by mouth q 8 h day 2 50mg  by mouth q 12 h day 3 50mg  by mough QHS day 4 09/03/16   Danelle BerryLeisa Tapia, PA-C  diphenhydrAMINE (BENADRYL) 25 MG tablet Take 50 mg by mouth every 6 (six) hours as needed for allergies.    Historical Provider, MD  loperamide (IMODIUM) 2 MG capsule Take 1 capsule (2 mg total) by mouth 4 (four) times daily as needed for diarrhea or loose stools. 09/03/16   Danelle BerryLeisa Tapia, PA-C  meloxicam (MOBIC) 7.5 MG tablet Take 1 tablet (7.5 mg total) by mouth daily as needed for pain.  08/30/16   Trixie DredgeEmily West, PA-C  Multiple Vitamin (MULTIVITAMIN WITH MINERALS) TABS tablet Take 1 tablet by mouth daily.    Historical Provider, MD  ondansetron (ZOFRAN ODT) 4 MG disintegrating tablet Take 1 tablet (4 mg total) by mouth every 8 (eight) hours as needed for nausea or vomiting. 09/03/16   Danelle BerryLeisa Tapia, PA-C  thiamine (VITAMIN B-1) 100 MG tablet Take 1 tablet (100 mg total) by mouth daily. 09/03/16   Danelle BerryLeisa Tapia, PA-C    Family History History reviewed. No pertinent family history.  Social History Social History  Substance Use Topics  . Smoking status: Current Every Day Smoker    Packs/day: 1.00  . Smokeless tobacco: Never Used  . Alcohol use 3.6 oz/week    6 Cans of beer per week     Comment: current every day drinker     Allergies   Patient has no known allergies.   Review of Systems Review of Systems  Constitutional: Negative for chills, diaphoresis and fever.  HENT: Negative for ear pain and sore throat.   Eyes: Negative for pain and visual disturbance.  Respiratory: Negative for cough and shortness of breath.   Cardiovascular: Positive for chest pain. Negative for palpitations.  Gastrointestinal: Negative for abdominal pain, nausea and vomiting.  Genitourinary: Negative for dysuria and hematuria.  Musculoskeletal: Negative for arthralgias and back pain.  Skin: Negative for color change  and rash.  Neurological: Negative for seizures and syncope.  All other systems reviewed and are negative.    Physical Exam Updated Vital Signs BP 121/80   Pulse 88   Temp 98.4 F (36.9 C) (Oral)   Resp 14   Ht 5\' 10"  (1.778 m)   Wt 63.5 kg   SpO2 99%   BMI 20.09 kg/m   Physical Exam  Constitutional: He appears well-developed and well-nourished.  HENT:  Head: Normocephalic and atraumatic.  Eyes: Conjunctivae are normal.  Neck: Neck supple.  Cardiovascular: Normal rate, regular rhythm and normal heart sounds.  Exam reveals no gallop and no friction rub.   No murmur  heard. Pulmonary/Chest: Effort normal and breath sounds normal. No respiratory distress. He has no wheezes. He has no rales.  Abdominal: Soft. He exhibits no distension and no mass. There is no tenderness. There is no guarding.  Musculoskeletal: He exhibits no edema.  Neurological: He is alert.  Skin: Skin is warm and dry. Capillary refill takes less than 2 seconds.  Psychiatric: He has a normal mood and affect.  Nursing note and vitals reviewed.    ED Treatments / Results  Labs (all labs ordered are listed, but only abnormal results are displayed) Labs Reviewed  CBC  PROTIME-INR  APTT  COMPREHENSIVE METABOLIC PANEL  LIPID PANEL  TROPONIN I  I-STAT TROPOININ, ED    EKG  EKG Interpretation  Date/Time:  Thursday October 25 2016 20:46:51 EST Ventricular Rate:  90 PR Interval:  142 QRS Duration: 96 QT Interval:  364 QTC Calculation: 445 R Axis:   75 Text Interpretation:  Normal sinus rhythm Biatrial enlargement Left ventricular hypertrophy ST elevation consider anterolateral injury or acute infarct ** ** ACUTE MI / STEMI ** ** Abnormal ECG Confirmed by KNOTT MD, Reuel Boom ([id number]) on 10/25/2016 8:49:40 PM       Radiology No results found.  Procedures Procedures (including critical care time)  Medications Ordered in ED Medications  0.9 %  sodium chloride infusion (not administered)  aspirin chewable tablet 324 mg (324 mg Oral Given 10/25/16 2056)     Initial Impression / Assessment and Plan / ED Course  I have reviewed the triage vital signs and the nursing notes.  Pertinent labs & imaging results that were available during my care of the patient were reviewed by me and considered in my medical decision making (see chart for details).  Clinical Course    This is a 55 year old gentleman comes to [place] with a brief episode of sharp left-sided chest pain. Never had this pain before. No shortness of breath no nausea no palpitations no diaphoresis associated. No treatment  was given and symptoms completely resolved. At triage he had ST elevations in V3 V4. Concerns for acute STEMI. He is brought back urgently. But without any chest pain. Aspirin is given. Blood pressure stable vital signs stable he is afebrile. Heart sounds normal lungs are clear. Repeat EKG at that time shows normal sinus rhythm without any signs of ischemia. Chest x-ray is unremarkable. Initial troponin is negative. Patient will be admitted to cardiology. Likely cath tomorrow. Heparin is given. Vital signs stable time and off of care. Further minute this patient's care please see inpatient team. Of note the patient was asked about illicit substance use and he denies any cocaine use currently however he does have a history of it. UDS is sent.  Final Clinical Impressions(s) / ED Diagnoses   Final diagnoses:  Chest pain    New Prescriptions New Prescriptions  No medications on file     Cherlynn PerchesEric Audric Venn, MD 10/25/16 2356    Lyndal Pulleyaniel Knott, MD 10/26/16 [phone]

## 2016-10-25 NOTE — ED Triage Notes (Signed)
Pt presents to ED after developing left sided CP after smoking a cigarette approx 10 minutes ago.  Pt denies any other associated symptoms.

## 2016-10-25 NOTE — ED Notes (Signed)
Report given to Judy.

## 2016-10-25 NOTE — ED Notes (Signed)
Pt given ice water and instructed to provide urine sample, urinal at bedside and within reach

## 2016-10-25 NOTE — Progress Notes (Signed)
ANTICOAGULATION CONSULT NOTE  Pharmacy Consult for heparin Indication: chest pain/ACS  Heparin Dosing Weight: 63.5kg   Assessment: 55 yom with CP on admit. Pharmacy consulted to dose heparin for ACS. Not on anticoagulation PTA. No CBC yet, no bleed documented.  Goal of Therapy:  Heparin level 0.3-0.7 units/ml Monitor platelets by anticoagulation protocol: Yes   Plan:  Heparin 3500 unit bolus Start heparin at 750 units/h 6h heparin level Daily heparin level/CBC Mon s/sx bleeding F/u Cards plans   Kenneth BertinHaley Jakaylee Leblanc, PharmD, BCPS Clinical Pharmacist 10/25/2016 9:10 PM

## 2016-10-25 NOTE — ED Notes (Signed)
Per  Dr. Clydene PughKnott at bedside, pt not going to cath lab at this time per cardiology consult; repeat EKG shows NO STEMI; pt A&Ox4 at this time and pain free

## 2016-10-25 NOTE — ED Provider Notes (Signed)
I saw and evaluated the patient, reviewed the resident's note and I agree with the findings and plan. Please see associated encounter note.  Arrival:  EKG Interpretation  Date/Time:  Thursday October 25 2016 20:46:51 EST Ventricular Rate:  90 PR Interval:  142 QRS Duration: 96 QT Interval:  364 QTC Calculation: 445 R Axis:   75 Text Interpretation:  Normal sinus rhythm Biatrial enlargement Left ventricular hypertrophy ST elevation consider anterolateral injury or acute infarct ** ** ACUTE MI / STEMI ** ** Abnormal ECG Confirmed by Amita Atayde MD, Myrl Lazarus [phone]([id number]) on 10/25/2016 8:49:40 PM       EKG Interpretation  Date/Time:  Thursday October 25 2016 20:53:37 EST Ventricular Rate:  79 PR Interval:  142 QRS Duration: 84 QT Interval:  376 QTC Calculation: 431 R Axis:   80 Text Interpretation:  Sinus rhythm Biatrial enlargement Left ventricular hypertrophy ST elevation resolved in V3 and V4 Since previous tracing Confirmed by Briyana Badman MD, Kazim Corrales ([id number]) on 10/25/2016 9:02:49 PM       55 y.o. male presents with chest pain and markedly elevated ST segments in V3-V4 with no reciprocal changes. Activated as STEMI but has no active pain on evaluation in trauma bay and resolved elevation raising possibility of coronary vasospasm. Denies cocaine use but positive on UDS. Cardiology to admit for possible anatomic evaluation.   Lyndal Pulleyaniel Kelise Kuch, MD 10/26/16 [phone]

## 2016-10-25 NOTE — ED Notes (Signed)
Pt given turkey sandwich

## 2016-10-25 NOTE — ED Notes (Signed)
Code Stemi cancelled @ 2134

## 2016-10-26 ENCOUNTER — Encounter (HOSPITAL_COMMUNITY)
Admission: EM | Disposition: A | Discharge status: Home / Self Care | Payer: Self-pay | Source: Home / Self Care | Attending: Internal Medicine

## 2016-10-26 ENCOUNTER — Encounter (HOSPITAL_COMMUNITY): Payer: Self-pay

## 2016-10-26 DIAGNOSIS — I2510 Atherosclerotic heart disease of native coronary artery without angina pectoris: Secondary | ICD-10-CM

## 2016-10-26 DIAGNOSIS — F1410 Cocaine abuse, uncomplicated: Secondary | ICD-10-CM

## 2016-10-26 HISTORY — PX: CARDIAC CATHETERIZATION: SHX172

## 2016-10-26 LAB — BASIC METABOLIC PANEL
ANION GAP: 11 (ref 5–15)
BUN: 18 mg/dL (ref 6–20)
CHLORIDE: 105 mmol/L (ref 101–111)
CO2: 23 mmol/L (ref 22–32)
Calcium: 8.7 mg/dL — ABNORMAL LOW (ref 8.9–10.3)
Creatinine, Ser: 1.01 mg/dL (ref 0.61–1.24)
GFR calc Af Amer: >60 mL/min (ref >60)
GLUCOSE: 87 mg/dL (ref 65–99)
POTASSIUM: 4.2 mmol/L (ref 3.5–5.1)
SODIUM: 139 mmol/L (ref 135–145)

## 2016-10-26 LAB — CBC
HCT: 37.7 % — ABNORMAL LOW (ref 39.0–52.0)
HEMATOCRIT: 38.3 % — AB (ref 39.0–52.0)
Hemoglobin: 13.2 g/dL (ref 13.0–17.0)
Hemoglobin: 13 g/dL (ref 13.0–17.0)
MCH: 32.7 pg (ref 26.0–34.0)
MCH: 32.4 pg (ref 26.0–34.0)
MCHC: 34.5 g/dL (ref 30.0–36.0)
MCHC: 34.5 g/dL (ref 30.0–36.0)
MCV: 94.8 fL (ref 78.0–100.0)
MCV: 94 fL (ref 78.0–100.0)
PLATELETS: 305 10*3/uL (ref 150–400)
Platelets: 289 10*3/uL (ref 150–400)
RBC: 4.04 MIL/uL — ABNORMAL LOW (ref 4.22–5.81)
RBC: 4.01 MIL/uL — AB (ref 4.22–5.81)
RDW: 15 % (ref 11.5–15.5)
RDW: 14.9 % (ref 11.5–15.5)
WBC: 7.4 10*3/uL (ref 4.0–10.5)
WBC: 7.7 10*3/uL (ref 4.0–10.5)

## 2016-10-26 LAB — RAPID URINE DRUG SCREEN, HOSP PERFORMED
AMPHETAMINES: NOT DETECTED
BENZODIAZEPINES: NOT DETECTED
Barbiturates: NOT DETECTED
Cocaine: POSITIVE — AB
OPIATES: NOT DETECTED
Tetrahydrocannabinol: POSITIVE — AB

## 2016-10-26 LAB — LIPID PANEL
CHOL/HDL RATIO: 2.1 ratio
CHOLESTEROL: 198 mg/dL (ref 0–200)
HDL: 95 mg/dL (ref >40)
LDL Cholesterol: 92 mg/dL (ref 0–99)
Triglycerides: 55 mg/dL (ref <150)
VLDL: 11 mg/dL (ref 0–40)

## 2016-10-26 LAB — MRSA PCR SCREENING: MRSA BY PCR: NEGATIVE

## 2016-10-26 LAB — HEPARIN LEVEL (UNFRACTIONATED): HEPARIN UNFRACTIONATED: 0.23 [IU]/mL — AB (ref 0.30–0.70)

## 2016-10-26 LAB — TROPONIN I: Troponin I: <0.03 ng/mL (ref <0.03)

## 2016-10-26 SURGERY — LEFT HEART CATH AND CORONARY ANGIOGRAPHY
Anesthesia: LOCAL

## 2016-10-26 MED ORDER — LIDOCAINE HCL (PF) 1 % IJ SOLN
INTRAMUSCULAR | Status: AC
  Filled 2016-10-26: qty 30

## 2016-10-26 MED ORDER — ASPIRIN 81 MG PO CHEW: 81.0000 mg | CHEWABLE_TABLET | Freq: Every day | ORAL | Status: AC

## 2016-10-26 MED ORDER — IOPAMIDOL (ISOVUE-370) INJECTION 76%
INTRAVENOUS | Status: AC
  Filled 2016-10-26: qty 100

## 2016-10-26 MED ORDER — HEPARIN (PORCINE) IN NACL 2-0.9 UNIT/ML-% IJ SOLN
INTRAMUSCULAR | Status: AC
  Filled 2016-10-26: qty 500

## 2016-10-26 MED ORDER — ONDANSETRON HCL 4 MG/2ML IJ SOLN: 4.0000 mg | Freq: Four times a day (QID) | INTRAMUSCULAR | Status: DC | PRN

## 2016-10-26 MED ORDER — ASPIRIN EC 81 MG PO TBEC: 81.0000 mg | DELAYED_RELEASE_TABLET | Freq: Every day | ORAL | Status: DC

## 2016-10-26 MED ORDER — SODIUM CHLORIDE 0.9 % IV SOLN: 250.0000 mL | INTRAVENOUS | Status: DC | PRN

## 2016-10-26 MED ORDER — HEPARIN SODIUM (PORCINE) 1000 UNIT/ML IJ SOLN
INTRAMUSCULAR | Status: AC
  Filled 2016-10-26: qty 1

## 2016-10-26 MED ORDER — SODIUM CHLORIDE 0.9 % WEIGHT BASED INFUSION: 1.0000 mL/kg/h | INTRAVENOUS | Status: DC

## 2016-10-26 MED ORDER — ATORVASTATIN CALCIUM 80 MG PO TABS: 80.0000 mg | ORAL_TABLET | Freq: Every day | ORAL | Status: DC

## 2016-10-26 MED ORDER — HEPARIN (PORCINE) IN NACL 2-0.9 UNIT/ML-% IJ SOLN
INTRAMUSCULAR | Status: AC
  Filled 2016-10-26: qty 1000

## 2016-10-26 MED ORDER — IOPAMIDOL (ISOVUE-370) INJECTION 76%
INTRAVENOUS | Status: DC | PRN
  Administered 2016-10-26: 30 mL via INTRA_ARTERIAL

## 2016-10-26 MED ORDER — FENTANYL CITRATE (PF) 100 MCG/2ML IJ SOLN
INTRAMUSCULAR | Status: DC | PRN
  Administered 2016-10-26 (×2): 25 ug via INTRAVENOUS

## 2016-10-26 MED ORDER — LIDOCAINE HCL (PF) 1 % IJ SOLN
INTRAMUSCULAR | Status: DC | PRN
  Administered 2016-10-26: 5 mL

## 2016-10-26 MED ORDER — FENTANYL CITRATE (PF) 100 MCG/2ML IJ SOLN
INTRAMUSCULAR | Status: AC
  Filled 2016-10-26: qty 2

## 2016-10-26 MED ORDER — ACETAMINOPHEN 325 MG PO TABS
650.0000 mg | ORAL_TABLET | ORAL | Status: DC | PRN
Start: 2016-10-26 — End: 2016-10-26

## 2016-10-26 MED ORDER — NITROGLYCERIN 0.4 MG SL SUBL
0.4000 mg | SUBLINGUAL_TABLET | SUBLINGUAL | 3 refills | Status: AC | PRN
Start: 2016-10-26 — End: ?

## 2016-10-26 MED ORDER — ACETAMINOPHEN 325 MG PO TABS: 650.0000 mg | ORAL_TABLET | ORAL | Status: DC | PRN

## 2016-10-26 MED ORDER — PNEUMOCOCCAL VAC POLYVALENT 25 MCG/0.5ML IJ INJ: 0.5000 mL | INJECTION | INTRAMUSCULAR | Status: DC

## 2016-10-26 MED ORDER — SODIUM CHLORIDE 0.9% FLUSH
3.0000 mL | Freq: Two times a day (BID) | INTRAVENOUS | Status: DC
  Administered 2016-10-26: 3 mL via INTRAVENOUS

## 2016-10-26 MED ORDER — VERAPAMIL HCL 2.5 MG/ML IV SOLN
INTRAVENOUS | Status: AC
  Filled 2016-10-26: qty 2

## 2016-10-26 MED ORDER — SODIUM CHLORIDE 0.9% FLUSH: 3.0000 mL | INTRAVENOUS | Status: DC | PRN

## 2016-10-26 MED ORDER — SODIUM CHLORIDE 0.9 % IV SOLN
INTRAVENOUS | Status: AC
  Administered 2016-10-26: 10:00:00 via INTRAVENOUS

## 2016-10-26 MED ORDER — SODIUM CHLORIDE 0.9% FLUSH: 3.0000 mL | Freq: Two times a day (BID) | INTRAVENOUS | Status: DC

## 2016-10-26 MED ORDER — HEPARIN (PORCINE) IN NACL 2-0.9 UNIT/ML-% IJ SOLN
INTRAMUSCULAR | Status: DC | PRN
  Administered 2016-10-26: 1500 mL

## 2016-10-26 MED ORDER — MIDAZOLAM HCL 2 MG/2ML IJ SOLN
INTRAMUSCULAR | Status: AC
  Filled 2016-10-26: qty 2

## 2016-10-26 MED ORDER — SODIUM CHLORIDE 0.9 % WEIGHT BASED INFUSION
3.0000 mL/kg/h | INTRAVENOUS | Status: DC
  Administered 2016-10-26: 3 mL/kg/h via INTRAVENOUS

## 2016-10-26 MED ORDER — ASPIRIN 81 MG PO CHEW
81.0000 mg | CHEWABLE_TABLET | ORAL | Status: AC
  Administered 2016-10-26: 81 mg via ORAL
  Filled 2016-10-26: qty 1

## 2016-10-26 MED ORDER — ASPIRIN 81 MG PO CHEW: 81.0000 mg | CHEWABLE_TABLET | Freq: Every day | ORAL | Status: DC

## 2016-10-26 MED ORDER — NITROGLYCERIN IN D5W 200-5 MCG/ML-% IV SOLN: 5.0000 ug/min | INTRAVENOUS | Status: DC

## 2016-10-26 MED ORDER — INFLUENZA VAC SPLIT QUAD 0.5 ML IM SUSY: 0.5000 mL | PREFILLED_SYRINGE | INTRAMUSCULAR | Status: DC

## 2016-10-26 MED ORDER — HEPARIN SODIUM (PORCINE) 1000 UNIT/ML IJ SOLN
INTRAMUSCULAR | Status: DC | PRN
  Administered 2016-10-26: 3000 [IU] via INTRAVENOUS

## 2016-10-26 MED ORDER — NITROGLYCERIN 0.4 MG SL SUBL: 0.4000 mg | SUBLINGUAL_TABLET | SUBLINGUAL | Status: DC | PRN

## 2016-10-26 MED ORDER — VERAPAMIL HCL 2.5 MG/ML IV SOLN
INTRAVENOUS | Status: DC | PRN
  Administered 2016-10-26: 09:00:00 via INTRA_ARTERIAL

## 2016-10-26 MED ORDER — MIDAZOLAM HCL 2 MG/2ML IJ SOLN
INTRAMUSCULAR | Status: DC | PRN
Start: 2016-10-26 — End: 2016-10-26
  Administered 2016-10-26: 1 mg via INTRAVENOUS
  Administered 2016-10-26: 2 mg via INTRAVENOUS

## 2016-10-26 SURGICAL SUPPLY — 13 items
CATH INFINITI 5 FR JL3.5 (CATHETERS) ×2 IMPLANT
CATH INFINITI 5FR ANG PIGTAIL (CATHETERS) ×2 IMPLANT
CATH INFINITI JR4 5F (CATHETERS) ×2 IMPLANT
DEVICE RAD COMP TR BAND LRG (VASCULAR PRODUCTS) ×2 IMPLANT
GLIDESHEATH SLEND SS 6F .021 (SHEATH) ×2 IMPLANT
GUIDEWIRE INQWIRE 1.5J.035X260 (WIRE) ×1 IMPLANT
INQWIRE 1.5J .035X260CM (WIRE) ×2
KIT HEART LEFT (KITS) ×2 IMPLANT
PACK CARDIAC CATHETERIZATION (CUSTOM PROCEDURE TRAY) ×2 IMPLANT
SYR MEDRAD MARK V 150ML (SYRINGE) IMPLANT
TRANSDUCER W/STOPCOCK (MISCELLANEOUS) ×2 IMPLANT
TUBING CIL FLEX 10 FLL-RA (TUBING) ×2 IMPLANT
WIRE HI TORQ VERSACORE-J 145CM (WIRE) ×2 IMPLANT

## 2016-10-26 NOTE — Progress Notes (Signed)
R radial site stable, level 0, radial pulse palpable +2. TR band removed per orders, tegaderm & gauze dsg in place. Education on mobility and range of motion restrictions provided.

## 2016-10-26 NOTE — Progress Notes (Signed)
Pt refuses to watch catheterization video.  Educated pt.  Will continue to monitor. Karena Addisonoro, Javyn Havlin T

## 2016-10-26 NOTE — Discharge Summary (Signed)
Discharge Summary    Patient ID: Kenneth Leblanc,  MRN: [id number], DOB/AGE: 53-Mar-1962 53 y.o.  Admit date: 10/25/2016 Discharge date: 10/26/2016  Primary Care Provider: No PCP Per Patient Primary Cardiologist: Dr Kenneth Leblanc  Discharge Diagnoses    Active Problems:   Unstable angina [facility](HCC)   Tobacco use   Coronary vasospasm   Cocaine use  Allergies No Known Allergies  Diagnostic Studies/Procedures    11/24 CARDIAC CATH:  The left ventricular ejection fraction is greater than 65% by visual estimate.  Prox LAD lesion, 30 %stenosed.  Assessment: 1. Minimal CAD with 30% ostial LAD stenosis otherwise normal coronaries 2. LVEF 65-70% with no regional wall motion abnormalities Plan/Discussion: Suspect symptoms related to coronary vasospasm in setting of cocaine use. Advised to stop cocaine use completely given risk of MI/death. Can go home today with ASA 81, statin and prn NTG.  _____________   History of Present Illness     55 y/o male with no known PMHx except for tobacco use presents to ER with CP and ST elevation.  Hospital Course     Consultants: NONE   His initial cardiac enzymes were negative for MI. His ST changes resolved and the code STEMI was cancelled. He was admitted.  His cardiac enzymes remained negative and his ECG was stable. He was pain-free. His drug screen was positive for cocaine and THC. Cardiac cath was performed to make sure there were not obstructive lesions.   Cath results are above, he had minor disease and his EF is normal.   Post-cath, he is ambulating without chest pain or SOB. His VS are stable. No further cardiac workup is indicated and he is considered stable for discharge, to follow up as an outpatient as needed. He will need to be on ASA and have SL NTG prn as well.   _____________  Discharge Vitals Blood pressure 120/71, pulse 64, temperature 98.3 F (36.8 C), temperature source Oral, resp. rate 14, height 5\' 10"  (1.778 m),  weight 131 lb (59.4 kg), SpO2 100 %.  Filed Weights   10/25/16 2059 10/26/16 0050  Weight: 140 lb (63.5 kg) 131 lb (59.4 kg)    Labs & Radiologic Studies    CBC  Recent Labs  10/26/16 0254 10/26/16 0650  WBC 7.4 7.7  HGB 13.2 13.0  HCT 38.3* 37.7*  MCV 94.8 94.0  PLT 289 305   Basic Metabolic Panel  Recent Labs  10/25/16 2046 10/26/16 0650  NA 137 139  K 4.3 4.2  CL 102 105  CO2 23 23  GLUCOSE 91 87  BUN 20 18  CREATININE 1.07 1.01  CALCIUM 9.4 8.7*   Liver Function Tests  Recent Labs  10/25/16 2046  AST 81*  ALT 39  ALKPHOS 67  BILITOT 0.4  PROT 7.4  ALBUMIN 4.1   Cardiac Enzymes  Recent Labs  10/25/16 2046 10/26/16 0254 10/26/16 0650  TROPONINI <0.03 <0.03 <0.03   Fasting Lipid Panel  Recent Labs  10/26/16 0254  CHOL 198  HDL 95  LDLCALC 92  TRIG 55  CHOLHDL 2.1   Drugs of Abuse     Component Value Date/Time   LABOPIA NONE DETECTED 10/25/2016 0011   COCAINSCRNUR POSITIVE (A) 10/25/2016 0011   LABBENZ NONE DETECTED 10/25/2016 0011   AMPHETMU NONE DETECTED 10/25/2016 0011   THCU POSITIVE (A) 10/25/2016 0011   LABBARB NONE DETECTED 10/25/2016 0011     _____________  Dg Chest Port 1 View  Result Date: 10/25/2016 CLINICAL DATA:  Severe  left-sided chest pain tonight while smoking cigarettes. Current smoker. EXAM: PORTABLE CHEST 1 VIEW COMPARISON:  08/30/2016 FINDINGS: The heart size and mediastinal contours are within normal limits. Both lungs are clear. The visualized skeletal structures are unremarkable. IMPRESSION: No active disease. Electronically Signed   By: Kenneth NievesWilliam  Leblanc M.D.   On: 10/25/2016 21:20   Disposition   Pt is being discharged home today in good condition.  Follow-up Plans & Appointments    Follow-up Information    Kenneth Leblanc, Kenneth Campion, Leblanc Follow up.   Specialty:  Cardiology Why:  As needed Contact information: [address] [place] [address] [phone]          Discharge  Instructions    Diet - low sodium heart healthy    Complete by:  As directed    Increase activity slowly    Complete by:  As directed       Discharge Medications   Current Discharge Medication List    START taking these medications   Details  aspirin 81 MG chewable tablet Chew 1 tablet (81 mg total) by mouth daily.    nitroGLYCERIN (NITROSTAT) 0.4 MG SL tablet Place 1 tablet (0.4 mg total) under the tongue every 5 (five) minutes as needed for chest pain. Qty: 25 tablet, Refills: 3      CONTINUE these medications which have NOT CHANGED   Details  acetaminophen (TYLENOL) 500 MG tablet Take 1,000 mg by mouth every 6 (six) hours as needed (shoulder pain).    diphenhydrAMINE (BENADRYL) 25 MG tablet Take 50 mg by mouth 2 (two) times daily as needed (itching from eczema).     Multiple Vitamin (MULTIVITAMIN WITH MINERALS) TABS tablet Take 1 tablet by mouth daily. One a day 50 plus    chlordiazePOXIDE (LIBRIUM) 25 MG capsule Take 2 capsules (50 mg total) by mouth 3 (three) times daily as needed for anxiety. 50mg  by mouth q 6 h day 1 50mg  by mouth q 8 h day 2 50mg  by mouth q 12 h day 3 50mg  by mough QHS day 4 Qty: 20 capsule, Refills: 0    loperamide (IMODIUM) 2 MG capsule Take 1 capsule (2 mg total) by mouth 4 (four) times daily as needed for diarrhea or loose stools. Qty: 12 capsule, Refills: 0    ondansetron (ZOFRAN ODT) 4 MG disintegrating tablet Take 1 tablet (4 mg total) by mouth every 8 (eight) hours as needed for nausea or vomiting. Qty: 10 tablet, Refills: 0    thiamine (VITAMIN Leblanc-1) 100 MG tablet Take 1 tablet (100 mg total) by mouth daily. Qty: 14 tablet, Refills: 0      STOP taking these medications     meloxicam (MOBIC) 7.5 MG tablet           Outstanding Labs/Studies   None  Duration of Discharge Encounter   Greater than 30 minutes including physician time.  Kenneth QuitterSigned, Leblanc, Rhonda NP 10/26/2016, 12:04 PM  Agree with above. Presentation consistent  with coronary vasospasm related to cocaine use. Counseled strongly on need to avoid cocaine use to reduce risk of recurrent event and sudden cardiac death.   Kenneth Bigos,Leblanc 8:39 AM

## 2016-10-26 NOTE — Plan of Care (Signed)
Problem: Health Behavior/Discharge Planning: Goal: Ability to manage health-related needs will improve Outcome: Progressing Pt received counseling on smoking cessation and drug abuse.

## 2016-10-26 NOTE — Progress Notes (Signed)
Cath lab notified of patient not being consented for procedure due to patient not understanding cath.  Patient agreed to & is currently watching cath video.

## 2016-10-26 NOTE — Discharge Instructions (Signed)

## 2016-10-26 NOTE — Progress Notes (Signed)
Pt. Discharged to home via wc, pt states he could not find a ride and that he will walk home- declined offer for bus voucher. Discharge instructions and medication regimen reviewed at bedside with patient. Pt. verbalizes understanding of instructions and medication regimen. Prescription sent to pharmacy and one sent with d/c papers. Patient assessment unchanged from this morning, R radial cath site stable without complications. TELE and IV discontinued per policy.

## 2016-10-26 NOTE — H&P (View-Only) (Signed)
Cardiology Rounding Note   Subjective:    Denies further chest pain. Anxious about cath. Troponin remains negative    Objective:   Weight Range:  Vital Signs:   Temp:  [98.1 F (36.7 C)-98.4 F (36.9 C)] 98.1 F (36.7 C) (11/24 0341) Pulse Rate:  [63-88] 63 (11/24 0700) Resp:  [8-20] 14 (11/24 0700) BP: (101-127)/(62-87) 117/87 (11/24 0700) SpO2:  [96 %-99 %] 98 % (11/24 0700) Weight:  [59.4 kg (131 lb)-63.5 kg (140 lb)] 59.4 kg (131 lb) (11/24 0050) Last BM Date: 10/25/16  Weight change: Filed Weights   10/25/16 2059 10/26/16 0050  Weight: 63.5 kg (140 lb) 59.4 kg (131 lb)    Intake/Output:   Intake/Output Summary (Last 24 hours) at 10/26/16 0759 Last data filed at 10/26/16 0740  Gross per 24 hour  Intake           433.97 ml  Output                0 ml  Net           433.97 ml     Physical Exam: General:  Thin Well appearing. No resp difficulty HEENT: normal Neck: supple. JVP . Carotids 2+ bilat; no bruits. No lymphadenopathy or thryomegaly appreciated. Cor: PMI nondisplaced. Regular rate & rhythm. No rubs, gallops or murmurs. Lungs: clear Abdomen: soft, nontender, nondistended. No hepatosplenomegaly. No bruits or masses. Good bowel sounds. Extremities: no cyanosis, clubbing, rash, edema Neuro: alert & orientedx3, cranial nerves grossly intact. moves all 4 extremities w/o difficulty. Affect pleasant  Telemetry: NSR no arrhythmias (personally reviewed)   Labs: Basic Metabolic Panel:  Recent Labs Lab 10/25/16 2046  NA 137  K 4.3  CL 102  CO2 23  GLUCOSE 91  BUN 20  CREATININE 1.07  CALCIUM 9.4    Liver Function Tests:  Recent Labs Lab 10/25/16 2046  AST 81*  ALT 39  ALKPHOS 67  BILITOT 0.4  PROT 7.4  ALBUMIN 4.1   No results for input(s): LIPASE, AMYLASE in the last 168 hours. No results for input(s): AMMONIA in the last 168 hours.  CBC:  Recent Labs Lab 10/25/16 2046 10/26/16 0254  WBC 10.0 7.4  HGB 13.4 13.2  HCT 39.8  38.3*  MCV 95.2 94.8  PLT 311 289    Cardiac Enzymes:  Recent Labs Lab 10/25/16 2046 10/26/16 0254  TROPONINI <0.03 <0.03    BNP: BNP (last 3 results) No results for input(s): BNP in the last 8760 hours.  ProBNP (last 3 results) No results for input(s): PROBNP in the last 8760 hours.    Other results:  Imaging: Dg Chest Port 1 View  Result Date: 10/25/2016 CLINICAL DATA:  Severe left-sided chest pain tonight while smoking cigarettes. Current smoker. EXAM: PORTABLE CHEST 1 VIEW COMPARISON:  08/30/2016 FINDINGS: The heart size and mediastinal contours are within normal limits. Both lungs are clear. The visualized skeletal structures are unremarkable. IMPRESSION: No active disease. Electronically Signed   By: Burman NievesWilliam  Stevens M.D.   On: 10/25/2016 21:20      Medications:     Scheduled Medications: . aspirin EC  81 mg Oral Daily  . atorvastatin  80 mg Oral q1800  . [START ON 10/27/2016] Influenza vac split quadrivalent PF  0.5 mL Intramuscular Tomorrow-1000  . [START ON 10/27/2016] pneumococcal 23 valent vaccine  0.5 mL Intramuscular Tomorrow-1000  . sodium chloride flush  3 mL Intravenous Q12H     Infusions: . sodium chloride 10 mL/hr (10/25/16 2104)  .  sodium chloride 1 mL/kg/hr (10/26/16 0700)  . heparin Stopped (10/26/16 0740)  . nitroGLYCERIN       PRN Medications:  sodium chloride, acetaminophen, nitroGLYCERIN, ondansetron (ZOFRAN) IV, sodium chloride flush   Assessment/Plan:   1. CP/ACS with rapidly resolving ST elevation anteriorly    -aborted STEMI vs spasm    --discussed the fact that CP and ECG changes likely due to cocaine related spasm but 50% have underlying CAD. He agrees to cath    --avoid b-blocker with potential of spasm    --continue ASA, heparin and statin  2. Tobacco use    -counseled on need to quit.   3. Cocaine +     -counseled on need to quit.   Bensimhon, Daniel,MD 8:02 AM      Length of Stay: 1   Bensimhon,  Daniel MD 10/26/2016, 7:59 AM Pager [phone] (M-F; 7a - 4p)  Please contact [facility] for night-coverage after hours (4p -7a ) and weekends on [email or web address]

## 2016-10-26 NOTE — Progress Notes (Signed)
ANTICOAGULATION CONSULT NOTE - Follow Up Consult  Pharmacy Consult for Heparin  Indication: chest pain/ACS  No Known Allergies  Patient Measurements: Height: 5\' 10"  (177.8 cm) Weight: 131 lb (59.4 kg) IBW/kg (Calculated) : 73 Vital Signs: Temp: 98.1 F (36.7 C) (11/24 0341) Temp Source: Oral (11/24 0341) BP: 110/70 (11/24 0341) Pulse Rate: 64 (11/24 0341)  Labs:  Recent Labs  10/25/16 2046 10/26/16 0254  HGB 13.4 13.2  HCT 39.8 38.3*  PLT 311 289  APTT 23*  --   LABPROT 12.8  --   INR 0.97  --   HEPARINUNFRC  --  0.23*  CREATININE 1.07  --   TROPONINI <0.03  --     Estimated Creatinine Clearance: 67.1 mL/min (by C-G formula based on SCr of 1.07 mg/dL).   Assessment: Heparin for CP, initial heparin level is slightly low, likely cath today, no issues per RN.   Goal of Therapy:  Heparin level 0.3-0.7 units/ml Monitor platelets by anticoagulation protocol: Yes   Plan:  -Inc heparin to 850 units/hr -1200 HL  Kenneth Leblanc 10/26/2016,3:54 AM

## 2016-10-26 NOTE — Interval H&P Note (Signed)
History and Physical Interval Note:  10/26/2016 8:05 AM  Kenneth Leblanc ShelterGordon  has presented today for surgery, with the diagnosis of NSTEMI  The various methods of treatment have been discussed with the patient and family. After consideration of risks, benefits and other options for treatment, the patient has consented to  Procedure(s): Left Heart Cath and Coronary Angiography (N/A) and possible angioplasty as a surgical intervention .  The patient's history has been reviewed, patient examined, no change in status, stable for surgery.  I have reviewed the patient's chart and labs.  Questions were answered to the patient's satisfaction.    Cath Lab Visit (complete for each Cath Lab visit)  Clinical Evaluation Leading to the Procedure:   ACS: Yes.     Arvilla MeresBensimhon, Dameshia Seybold MD

## 2016-10-26 NOTE — Progress Notes (Signed)
Dr. Gala RomneyBensimhon at bedside discussing cath with patient. Patient now consents to procedure, signed and cath lab notified.

## 2016-10-26 NOTE — Progress Notes (Signed)
Patient ambulated around entire unit twice, no reports of shortness of breath or chest pain/pressure/discomfort. Tolerated well.

## 2016-10-26 NOTE — Progress Notes (Signed)
Cardiology Rounding Note   Subjective:    Denies further chest pain. Anxious about cath. Troponin remains negative    Objective:   Weight Range:  Vital Signs:   Temp:  [98.1 F (36.7 C)-98.4 F (36.9 C)] 98.1 F (36.7 C) (11/24 0341) Pulse Rate:  [63-88] 63 (11/24 0700) Resp:  [8-20] 14 (11/24 0700) BP: (101-127)/(62-87) 117/87 (11/24 0700) SpO2:  [96 %-99 %] 98 % (11/24 0700) Weight:  [59.4 kg (131 lb)-63.5 kg (140 lb)] 59.4 kg (131 lb) (11/24 0050) Last BM Date: 10/25/16  Weight change: Filed Weights   10/25/16 2059 10/26/16 0050  Weight: 63.5 kg (140 lb) 59.4 kg (131 lb)    Intake/Output:   Intake/Output Summary (Last 24 hours) at 10/26/16 0759 Last data filed at 10/26/16 0740  Gross per 24 hour  Intake           433.97 ml  Output                0 ml  Net           433.97 ml     Physical Exam: General:  Thin Well appearing. No resp difficulty HEENT: normal Neck: supple. JVP . Carotids 2+ bilat; no bruits. No lymphadenopathy or thryomegaly appreciated. Cor: PMI nondisplaced. Regular rate & rhythm. No rubs, gallops or murmurs. Lungs: clear Abdomen: soft, nontender, nondistended. No hepatosplenomegaly. No bruits or masses. Good bowel sounds. Extremities: no cyanosis, clubbing, rash, edema Neuro: alert & orientedx3, cranial nerves grossly intact. moves all 4 extremities w/o difficulty. Affect pleasant  Telemetry: NSR no arrhythmias (personally reviewed)   Labs: Basic Metabolic Panel:  Recent Labs Lab 10/25/16 2046  NA 137  K 4.3  CL 102  CO2 23  GLUCOSE 91  BUN 20  CREATININE 1.07  CALCIUM 9.4    Liver Function Tests:  Recent Labs Lab 10/25/16 2046  AST 81*  ALT 39  ALKPHOS 67  BILITOT 0.4  PROT 7.4  ALBUMIN 4.1   No results for input(s): LIPASE, AMYLASE in the last 168 hours. No results for input(s): AMMONIA in the last 168 hours.  CBC:  Recent Labs Lab 10/25/16 2046 10/26/16 0254  WBC 10.0 7.4  HGB 13.4 13.2  HCT 39.8  38.3*  MCV 95.2 94.8  PLT 311 289    Cardiac Enzymes:  Recent Labs Lab 10/25/16 2046 10/26/16 0254  TROPONINI <0.03 <0.03    BNP: BNP (last 3 results) No results for input(s): BNP in the last 8760 hours.  ProBNP (last 3 results) No results for input(s): PROBNP in the last 8760 hours.    Other results:  Imaging: Dg Chest Port 1 View  Result Date: 10/25/2016 CLINICAL DATA:  Severe left-sided chest pain tonight while smoking cigarettes. Current smoker. EXAM: PORTABLE CHEST 1 VIEW COMPARISON:  08/30/2016 FINDINGS: The heart size and mediastinal contours are within normal limits. Both lungs are clear. The visualized skeletal structures are unremarkable. IMPRESSION: No active disease. Electronically Signed   By: Burman NievesWilliam  Stevens M.D.   On: 10/25/2016 21:20      Medications:     Scheduled Medications: . aspirin EC  81 mg Oral Daily  . atorvastatin  80 mg Oral q1800  . [START ON 10/27/2016] Influenza vac split quadrivalent PF  0.5 mL Intramuscular Tomorrow-1000  . [START ON 10/27/2016] pneumococcal 23 valent vaccine  0.5 mL Intramuscular Tomorrow-1000  . sodium chloride flush  3 mL Intravenous Q12H     Infusions: . sodium chloride 10 mL/hr (10/25/16 2104)  .  sodium chloride 1 mL/kg/hr (10/26/16 0700)  . heparin Stopped (10/26/16 0740)  . nitroGLYCERIN       PRN Medications:  sodium chloride, acetaminophen, nitroGLYCERIN, ondansetron (ZOFRAN) IV, sodium chloride flush   Assessment/Plan:   1. CP/ACS with rapidly resolving ST elevation anteriorly    -aborted STEMI vs spasm    --discussed the fact that CP and ECG changes likely due to cocaine related spasm but 50% have underlying CAD. He agrees to cath    --avoid b-blocker with potential of spasm    --continue ASA, heparin and statin  2. Tobacco use    -counseled on need to quit.   3. Cocaine +     -counseled on need to quit.   Jacqeline Broers,MD 8:02 AM      Length of Stay: 1   Tanaisha Pittman,  Deepika Decatur MD 10/26/2016, 7:59 AM Pager [phone] (M-F; 7a - 4p)  Please contact [facility] for night-coverage after hours (4p -7a ) and weekends on [email or web address]  

## 2017-01-04 ENCOUNTER — Emergency Department (HOSPITAL_COMMUNITY)
Admission: EM | Admit: 2017-01-04 | Discharge: 2017-01-04 | Disposition: A | Discharge status: Home / Self Care | Payer: Self-pay | Attending: Emergency Medicine | Admitting: Emergency Medicine

## 2017-01-04 DIAGNOSIS — F17200 Nicotine dependence, unspecified, uncomplicated: Secondary | ICD-10-CM | POA: Insufficient documentation

## 2017-01-04 DIAGNOSIS — Z7982 Long term (current) use of aspirin: Secondary | ICD-10-CM | POA: Insufficient documentation

## 2017-01-04 DIAGNOSIS — R197 Diarrhea, unspecified: Secondary | ICD-10-CM | POA: Insufficient documentation

## 2017-01-04 LAB — COMPREHENSIVE METABOLIC PANEL
ALK PHOS: 63 U/L (ref 38–126)
ALT: 25 U/L (ref 17–63)
ANION GAP: 8 (ref 5–15)
AST: 22 U/L (ref 15–41)
Albumin: 4 g/dL (ref 3.5–5.0)
BUN: 17 mg/dL (ref 6–20)
CALCIUM: 9.4 mg/dL (ref 8.9–10.3)
CO2: 28 mmol/L (ref 22–32)
CREATININE: 1.19 mg/dL (ref 0.61–1.24)
Chloride: 102 mmol/L (ref 101–111)
Glucose, Bld: 92 mg/dL (ref 65–99)
Potassium: 4.5 mmol/L (ref 3.5–5.1)
SODIUM: 138 mmol/L (ref 135–145)
Total Bilirubin: 0.7 mg/dL (ref 0.3–1.2)
Total Protein: 7 g/dL (ref 6.5–8.1)

## 2017-01-04 LAB — URINALYSIS, ROUTINE W REFLEX MICROSCOPIC
Bilirubin Urine: NEGATIVE
Glucose, UA: NEGATIVE mg/dL
Hgb urine dipstick: NEGATIVE
Ketones, ur: NEGATIVE mg/dL
LEUKOCYTES UA: NEGATIVE
NITRITE: NEGATIVE
PROTEIN: NEGATIVE mg/dL
SPECIFIC GRAVITY, URINE: 1.024 (ref 1.005–1.030)
pH: 5 (ref 5.0–8.0)

## 2017-01-04 LAB — CBC
HCT: 39.2 % (ref 39.0–52.0)
HEMOGLOBIN: 13.3 g/dL (ref 13.0–17.0)
MCH: 31.9 pg (ref 26.0–34.0)
MCHC: 33.9 g/dL (ref 30.0–36.0)
MCV: 94 fL (ref 78.0–100.0)
PLATELETS: 286 10*3/uL (ref 150–400)
RBC: 4.17 MIL/uL — AB (ref 4.22–5.81)
RDW: 13.6 % (ref 11.5–15.5)
WBC: 7.7 10*3/uL (ref 4.0–10.5)

## 2017-01-04 LAB — MAGNESIUM: MAGNESIUM: 2 mg/dL (ref 1.7–2.4)

## 2017-01-04 LAB — LIPASE, BLOOD: LIPASE: 17 U/L (ref 11–51)

## 2017-01-04 MED ORDER — SODIUM CHLORIDE 0.9 % IV BOLUS (SEPSIS)
1000.0000 mL | Freq: Once | INTRAVENOUS | Status: AC
  Administered 2017-01-04: 1000 mL via INTRAVENOUS

## 2017-01-04 MED ORDER — LOPERAMIDE HCL 2 MG PO CAPS: 2.0000 mg | ORAL_CAPSULE | Freq: Four times a day (QID) | ORAL | 0 refills | Status: DC | PRN

## 2017-01-04 NOTE — ED Provider Notes (Signed)
[facility]-EMERGENCY DEPT Provider Note   CSN: [id number] Arrival date & time: 01/04/17  [id number]     History   Chief Complaint Chief Complaint  Patient presents with  . Diarrhea   HPI   Blood pressure 133/80, pulse 76, temperature 98.1 F (36.7 C), temperature source Oral, resp. rate 18, SpO2 99 %.  Kenneth Leblanc is a 56 y.o. male complaining of watery diarrhea onset 4 days ago, initially improving but now worsening. He denies melena, hematochezia, abdominal pain, vomiting, sick contacts, recent antibiotics. He's taking Imodium at home with little relief. He endorses nausea on review of systems.   Past Medical History:  Diagnosis Date  . Anginal pain [facility](HCC)     Patient Active Problem List   Diagnosis Date Noted  . Unstable angina (HCC) 10/25/2016  . Tobacco use 10/25/2016    Past Surgical History:  Procedure Laterality Date  . ABDOMINAL SURGERY    . CARDIAC CATHETERIZATION N/A 10/26/2016   Procedure: Left Heart Cath and Coronary Angiography;  Surgeon: Dolores Pattyaniel R Bensimhon, MD;  [facility];  Service: Cardiovascular;  Laterality: N/A;       Home Medications    Prior to Admission medications   Medication Sig Start Date End Date Taking? Authorizing Provider  acetaminophen (TYLENOL) 500 MG tablet Take 1,000 mg by mouth every 6 (six) hours as needed (shoulder pain).    Historical Provider, MD  aspirin 81 MG chewable tablet Chew 1 tablet (81 mg total) by mouth daily. 10/27/16   Rhonda G Barrett, PA-C  chlordiazePOXIDE (LIBRIUM) 25 MG capsule Take 2 capsules (50 mg total) by mouth 3 (three) times daily as needed for anxiety. 50mg  by mouth q 6 h day 1 50mg  by mouth q 8 h day 2 50mg  by mouth q 12 h day 3 50mg  by mough QHS day 4 Patient not taking: Reported on 10/25/2016 09/03/16   Danelle BerryLeisa Tapia, PA-C  diphenhydrAMINE (BENADRYL) 25 MG tablet Take 50 mg by mouth 2 (two) times daily as needed (itching from eczema).     Historical Provider, MD  loperamide (IMODIUM) 2 MG  capsule Take 1 capsule (2 mg total) by mouth 4 (four) times daily as needed for diarrhea or loose stools. 01/04/17   Joni ReiningNicole Marishka Rentfrow, PA-C  Multiple Vitamin (MULTIVITAMIN WITH MINERALS) TABS tablet Take 1 tablet by mouth daily. One a day 50 plus    Historical Provider, MD  nitroGLYCERIN (NITROSTAT) 0.4 MG SL tablet Place 1 tablet (0.4 mg total) under the tongue every 5 (five) minutes as needed for chest pain. 10/26/16   Rhonda G Barrett, PA-C  ondansetron (ZOFRAN ODT) 4 MG disintegrating tablet Take 1 tablet (4 mg total) by mouth every 8 (eight) hours as needed for nausea or vomiting. Patient not taking: Reported on 10/25/2016 09/03/16   Danelle BerryLeisa Tapia, PA-C  thiamine (VITAMIN B-1) 100 MG tablet Take 1 tablet (100 mg total) by mouth daily. Patient not taking: Reported on 10/25/2016 09/03/16   Danelle BerryLeisa Tapia, PA-C    Family History No family history on file.  Social History Social History  Substance Use Topics  . Smoking status: Current Every Day Smoker    Packs/day: 1.00  . Smokeless tobacco: Never Used  . Alcohol use 3.6 oz/week    6 Cans of beer per week     Comment: current every day drinker     Allergies   Patient has no known allergies.   Review of Systems Review of Systems  10 systems reviewed and found to be negative,  except as noted in the HPI.  Physical Exam Updated Vital Signs BP 136/89   Pulse 76   Temp 98.1 F (36.7 C) (Oral)   Resp 18   SpO2 99%   Physical Exam  Constitutional: He is oriented to person, place, and time. He appears well-developed and well-nourished. No distress.  HENT:  Head: Normocephalic and atraumatic.  Mouth/Throat: Oropharynx is clear and moist.  Eyes: Conjunctivae and EOM are normal. Pupils are equal, round, and reactive to light.  Neck: Normal range of motion.  Cardiovascular: Normal rate, regular rhythm and intact distal pulses.   Pulmonary/Chest: Effort normal and breath sounds normal. No respiratory distress. He has no wheezes. He has no  rales. He exhibits no tenderness.  Abdominal: Soft. He exhibits no distension and no mass. There is no tenderness. There is no rebound and no guarding. No hernia.  Musculoskeletal: Normal range of motion.  Neurological: He is alert and oriented to person, place, and time.  Skin: Capillary refill takes less than 2 seconds. He is not diaphoretic.  Psychiatric: He has a normal mood and affect.  Nursing note and vitals reviewed.    ED Treatments / Results  Labs (all labs ordered are listed, but only abnormal results are displayed) Labs Reviewed  CBC - Abnormal; Notable for the following:       Result Value   RBC 4.17 (*)    All other components within normal limits  GASTROINTESTINAL PANEL BY PCR, STOOL (REPLACES STOOL CULTURE)  LIPASE, BLOOD  COMPREHENSIVE METABOLIC PANEL  URINALYSIS, ROUTINE W REFLEX MICROSCOPIC  MAGNESIUM    EKG  EKG Interpretation None       Radiology No results found.  Procedures Procedures (including critical care time)  Medications Ordered in ED Medications  sodium chloride 0.9 % bolus 1,000 mL (0 mLs Intravenous Stopped 01/04/17 1039)     Initial Impression / Assessment and Plan / ED Course  I have reviewed the triage vital signs and the nursing notes.  Pertinent labs & imaging results that were available during my care of the patient were reviewed by me and considered in my medical decision making (see chart for details).    Vitals:   01/04/17 0852 01/04/17 1000 01/04/17 1015 01/04/17 1030  BP: 133/80 136/87 148/90 136/89  Pulse: 76     Resp: 18     Temp: 98.1 F (36.7 C)     TempSrc: Oral     SpO2: 99%       Medications  sodium chloride 0.9 % bolus 1,000 mL (0 mLs Intravenous Stopped 01/04/17 1039)    Kenneth Leblanc is 56 y.o. male presenting with Several days of diarrhea, patient nontoxic appearing, abdominal exam is benign, he is tolerating by mouth's. Blood work reassuring. 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  Patient was unable to give [place] a stool sample for further testing.  Evaluation does not show pathology that would require ongoing emergent intervention or inpatient treatment. Pt is hemodynamically stable and mentating appropriately. Discussed findings and plan with patient/guardian, who agrees with care plan. All questions answered. Return precautions discussed and outpatient follow up given.      Final Clinical Impressions(s) / ED Diagnoses   Final diagnoses:  Diarrhea, unspecified type    New Prescriptions Discharge Medication List as of 01/04/2017 10:33 AM       Wynetta Emery, PA-C 01/04/17 1513    Jacalyn Lefevre, MD 01/05/17 [phone]

## 2017-01-04 NOTE — ED Triage Notes (Signed)
Pt reports diarrhea since Tuesday morning. Took imodium w/ some relief of symptoms. Denies abd pain.

## 2017-01-04 NOTE — Discharge Instructions (Signed)
Please follow with your primary care doctor in the next 2 days for a check-up. They must obtain records for further management.  ° °Do not hesitate to return to the Emergency Department for any new, worsening or concerning symptoms.  ° °

## 2018-01-29 ENCOUNTER — Emergency Department (HOSPITAL_COMMUNITY)
Admission: EM | Admit: 2018-01-29 | Discharge: 2018-01-29 | Discharge status: Against Medical Advice | Payer: Self-pay | Attending: Emergency Medicine | Admitting: Emergency Medicine

## 2018-01-29 ENCOUNTER — Other Ambulatory Visit: Payer: Self-pay

## 2018-01-29 DIAGNOSIS — Z5321 Procedure and treatment not carried out due to patient leaving prior to being seen by health care provider: Secondary | ICD-10-CM | POA: Insufficient documentation

## 2018-01-29 NOTE — ED Notes (Signed)
Pt got up and walked out of the ED and said he didn't want to be seen by a doctor, pt was able to walk and talk on his way out

## 2018-01-29 NOTE — ED Triage Notes (Signed)
Pt arrived via EMS pt was fnd laying on the floor in [employer] near BP machine. Pt reports use of Ethol use and asked EMS to bring pt to Hospital. Pt has some confusion due to intoxication. Per EMS pt has been pleasant and non combative.    V/S BP 140/80, HR 84, RR 18, O2 sat 100% RA

## 2018-03-08 ENCOUNTER — Encounter (HOSPITAL_COMMUNITY): Payer: Self-pay | Admitting: Emergency Medicine

## 2018-03-08 ENCOUNTER — Other Ambulatory Visit: Payer: Self-pay

## 2018-03-08 ENCOUNTER — Encounter (HOSPITAL_COMMUNITY): Payer: Self-pay | Admitting: *Deleted

## 2018-03-08 ENCOUNTER — Emergency Department (HOSPITAL_COMMUNITY)
Admission: EM | Admit: 2018-03-08 | Discharge: 2018-03-09 | Disposition: A | Discharge status: Home / Self Care | Payer: Self-pay | Attending: Emergency Medicine | Admitting: Emergency Medicine

## 2018-03-08 ENCOUNTER — Emergency Department (HOSPITAL_COMMUNITY)
Admission: EM | Admit: 2018-03-08 | Discharge: 2018-03-08 | Disposition: A | Discharge status: Home / Self Care | Payer: Self-pay | Attending: Emergency Medicine | Admitting: Emergency Medicine

## 2018-03-08 ENCOUNTER — Emergency Department (HOSPITAL_COMMUNITY): Payer: Self-pay

## 2018-03-08 DIAGNOSIS — R0781 Pleurodynia: Secondary | ICD-10-CM | POA: Insufficient documentation

## 2018-03-08 DIAGNOSIS — Z720 Tobacco use: Secondary | ICD-10-CM

## 2018-03-08 DIAGNOSIS — I213 ST elevation (STEMI) myocardial infarction of unspecified site: Secondary | ICD-10-CM

## 2018-03-08 DIAGNOSIS — F17210 Nicotine dependence, cigarettes, uncomplicated: Secondary | ICD-10-CM | POA: Insufficient documentation

## 2018-03-08 DIAGNOSIS — Z7982 Long term (current) use of aspirin: Secondary | ICD-10-CM | POA: Insufficient documentation

## 2018-03-08 DIAGNOSIS — F17200 Nicotine dependence, unspecified, uncomplicated: Secondary | ICD-10-CM | POA: Insufficient documentation

## 2018-03-08 DIAGNOSIS — R079 Chest pain, unspecified: Secondary | ICD-10-CM

## 2018-03-08 DIAGNOSIS — R072 Precordial pain: Secondary | ICD-10-CM

## 2018-03-08 LAB — CBC
HCT: 42 % (ref 39.0–52.0)
Hemoglobin: 14.1 g/dL (ref 13.0–17.0)
MCH: 31.9 pg (ref 26.0–34.0)
MCHC: 33.6 g/dL (ref 30.0–36.0)
MCV: 95 fL (ref 78.0–100.0)
PLATELETS: 333 10*3/uL (ref 150–400)
RBC: 4.42 MIL/uL (ref 4.22–5.81)
RDW: 14.7 % (ref 11.5–15.5)
WBC: 7.9 10*3/uL (ref 4.0–10.5)

## 2018-03-08 LAB — BASIC METABOLIC PANEL
Anion gap: 13 (ref 5–15)
BUN: 12 mg/dL (ref 6–20)
CHLORIDE: 103 mmol/L (ref 101–111)
CO2: 22 mmol/L (ref 22–32)
CREATININE: 1.06 mg/dL (ref 0.61–1.24)
Calcium: 8.9 mg/dL (ref 8.9–10.3)
GFR calc Af Amer: >60 mL/min (ref >60)
GFR calc non Af Amer: >60 mL/min (ref >60)
Glucose, Bld: 116 mg/dL — ABNORMAL HIGH (ref 65–99)
Potassium: 4.5 mmol/L (ref 3.5–5.1)
Sodium: 138 mmol/L (ref 135–145)

## 2018-03-08 LAB — I-STAT CHEM 8, ED
BUN: 14 mg/dL (ref 6–20)
CALCIUM ION: 1 mmol/L — AB (ref 1.15–1.40)
Chloride: 104 mmol/L (ref 101–111)
Creatinine, Ser: 1.4 mg/dL — ABNORMAL HIGH (ref 0.61–1.24)
Glucose, Bld: 113 mg/dL — ABNORMAL HIGH (ref 65–99)
HEMATOCRIT: 46 % (ref 39.0–52.0)
Hemoglobin: 15.6 g/dL (ref 13.0–17.0)
Potassium: 4.5 mmol/L (ref 3.5–5.1)
Sodium: 140 mmol/L (ref 135–145)
TCO2: 25 mmol/L (ref 22–32)

## 2018-03-08 LAB — I-STAT TROPONIN, ED: Troponin i, poc: 0.01 ng/mL (ref 0.00–0.08)

## 2018-03-08 MED ORDER — CYCLOBENZAPRINE HCL 10 MG PO TABS: 10.0000 mg | ORAL_TABLET | Freq: Two times a day (BID) | ORAL | 0 refills | Status: DC | PRN

## 2018-03-08 MED ORDER — METHOCARBAMOL 500 MG PO TABS
500.0000 mg | ORAL_TABLET | Freq: Once | ORAL | Status: AC
  Administered 2018-03-08: 500 mg via ORAL
  Filled 2018-03-08: qty 1

## 2018-03-08 MED ORDER — SODIUM CHLORIDE 0.9 % IV BOLUS
1000.0000 mL | Freq: Once | INTRAVENOUS | Status: AC
  Administered 2018-03-08: 1000 mL via INTRAVENOUS

## 2018-03-08 MED ORDER — NAPROXEN 500 MG PO TABS: 500.0000 mg | ORAL_TABLET | Freq: Two times a day (BID) | ORAL | 0 refills | Status: DC

## 2018-03-08 MED ORDER — KETOROLAC TROMETHAMINE 30 MG/ML IJ SOLN
30.0000 mg | Freq: Once | INTRAMUSCULAR | Status: AC
  Administered 2018-03-08: 30 mg via INTRAMUSCULAR
  Filled 2018-03-08: qty 1

## 2018-03-08 NOTE — ED Provider Notes (Signed)
[facility] EMERGENCY DEPARTMENT Provider Note   CSN: [id number] Arrival date & time: 03/08/18  0234     History   Chief Complaint Chief Complaint  Patient presents with  . rib    HPI Kenneth Leblanc is a 57 y.o. male.  HPI Kenneth Leblanc is a 57 y.o. male presents to ED with complaint of right sided rib pain. Pt states he has had vague soreness to the right side for about 2 weeks.  He states last night the pain became more sharp and severe which made him come to emergency department.  Pain is in the right lower ribs.  It does not radiate.  Denies any pleurisy.  Pain is worsened with movement and palpation of the side.  Denies any rashes to the area.  No fever or chills.  No cough or congestion.  Denies any known injuries.  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  No flank pain. No urinary symptoms.  He has no other complaints.  He has not tried any medications prior to coming in.  Past Medical History:  Diagnosis Date  . Anginal pain [facility])     Patient Active Problem List   Diagnosis Date Noted  . Unstable angina (HCC) 10/25/2016  . Tobacco use 10/25/2016    Past Surgical History:  Procedure Laterality Date  . ABDOMINAL SURGERY    . CARDIAC CATHETERIZATION N/A 10/26/2016   Procedure: Left Heart Cath and Coronary Angiography;  Surgeon: Dolores Patty, MD;  Location: [facility] INVASIVE CV LAB;  Service: Cardiovascular;  Laterality: N/A;        Home Medications    Prior to Admission medications   Medication Sig Start Date End Date Taking? Authorizing Provider  acetaminophen (TYLENOL) 500 MG tablet Take 1,000 mg by mouth every 6 (six) hours as needed (shoulder pain).    [provider]  aspirin 81 MG chewable tablet Chew 1 tablet (81 mg total) by mouth daily. 10/27/16   Barrett, Joline Salt, PA-C  chlordiazePOXIDE (LIBRIUM) 25 MG capsule Take 2 capsules (50 mg total) by mouth 3 (three) times daily as needed for anxiety.  50mg  by mouth q 6 h day 1 50mg  by mouth q 8 h day 2 50mg  by mouth q 12 h day 3 50mg  by mough QHS day 4 Patient not taking: Reported on 10/25/2016 09/03/16   Danelle Berry, PA-C  diphenhydrAMINE (BENADRYL) 25 MG tablet Take 50 mg by mouth 2 (two) times daily as needed (itching from eczema).     [provider]  loperamide (IMODIUM) 2 MG capsule Take 1 capsule (2 mg total) by mouth 4 (four) times daily as needed for diarrhea or loose stools. 01/04/17   Pisciotta, Joni Reining, PA-C  Multiple Vitamin (MULTIVITAMIN WITH MINERALS) TABS tablet Take 1 tablet by mouth daily. One a day 50 plus    [provider]  nitroGLYCERIN (NITROSTAT) 0.4 MG SL tablet Place 1 tablet (0.4 mg total) under the tongue every 5 (five) minutes as needed for chest pain. 10/26/16   Barrett, Joline Salt, PA-C  ondansetron (ZOFRAN ODT) 4 MG disintegrating tablet Take 1 tablet (4 mg total) by mouth every 8 (eight) hours as needed for nausea or vomiting. Patient not taking: Reported on 10/25/2016 09/03/16   Danelle Berry, PA-C  thiamine (VITAMIN B-1) 100 MG tablet Take 1 tablet (100 mg total) by mouth daily. Patient not taking: Reported on 10/25/2016 09/03/16   Danelle Berry, PA-C  Family History No family history on file.  Social History Social History   Tobacco Use  . Smoking status: Current Every Day Smoker    Packs/day: 1.00  . Smokeless tobacco: Never Used  Substance Use Topics  . Alcohol use: Yes    Alcohol/week: 3.6 oz    Types: 6 Cans of beer per week    Comment: current every day drinker  . Drug use: Yes    Types: Marijuana    Comment: today      Allergies   Patient has no known allergies.   Review of Systems Review of Systems  Constitutional: Negative for chills and fever.  Respiratory: Negative for cough, chest tightness and shortness of breath.   Cardiovascular: Positive for chest pain. Negative for palpitations and leg swelling.  Gastrointestinal: Negative for abdominal distention,  abdominal pain, diarrhea, nausea and vomiting.  Genitourinary: Negative for dysuria, frequency, hematuria and urgency.  Musculoskeletal: Negative for arthralgias, myalgias, neck pain and neck stiffness.  Skin: Negative for rash.  Allergic/Immunologic: Negative for immunocompromised state.  Neurological: Negative for dizziness, weakness, light-headedness, numbness and headaches.     Physical Exam Updated Vital Signs BP 125/75 (BP Location: Right Arm)   Pulse 80   Temp 97.9 F (36.6 C) (Oral)   Resp 16   Ht 5\' 10"  (1.778 m)   Wt 61.2 kg (135 lb)   SpO2 99%   BMI 19.37 kg/m   Physical Exam  Constitutional: He appears well-developed and well-nourished. No distress.  HENT:  Head: Normocephalic and atraumatic.  Eyes: Conjunctivae are normal.  Neck: Neck supple.  Cardiovascular: Normal rate, regular rhythm and normal heart sounds.  Pulmonary/Chest: Effort normal. No respiratory distress. He has no wheezes. He has no rales. He exhibits tenderness.  Tenderness to palpation over lower intercostal muscles in the right mid axillary line.  Pain worsened with movement of the right arm.  No rashes or skin discoloration noted to the area.  Abdominal: Soft. Bowel sounds are normal. He exhibits no distension. There is no tenderness. There is no rebound.  No CVA tenderness  Musculoskeletal: He exhibits no edema.       Back:  Neurological: He is alert.  Skin: Skin is warm and dry.  Nursing note and vitals reviewed.    ED Treatments / Results  Labs (all labs ordered are listed, but only abnormal results are displayed) Labs Reviewed - No data to display  EKG None  Radiology Dg Ribs Unilateral W/chest Right  Result Date: 03/08/2018 CLINICAL DATA:  Right lower lateral rib pain since this morning. EXAM: RIGHT RIBS AND CHEST - 3+ VIEW COMPARISON:  10/25/2016 CXR FINDINGS: No fracture or other bone lesions are seen involving the ribs. There is no evidence of pneumothorax or pleural effusion.  Both lungs are clear. Heart size and mediastinal contours are within normal limits. Minimal degenerative spurring off the inferior glenoid and distal clavicle. IMPRESSION: No acute osseous abnormality of the right ribs. Electronically Signed   By: Tollie Eth M.D.   On: 03/08/2018 03:40    Procedures Procedures (including critical care time)  Medications Ordered in ED Medications  ketorolac (TORADOL) 30 MG/ML injection 30 mg (has no administration in time range)  methocarbamol (ROBAXIN) tablet 500 mg (has no administration in time range)     Initial Impression / Assessment and Plan / ED Course  I have reviewed the triage vital signs and the nursing notes.  Pertinent labs & imaging results that were available during my care of the patient  were reviewed by me and considered in my medical decision making (see chart for details).     Patient with right-sided lower rib pain for 2 weeks, worsened last night.  On exam, patient has tenderness over intercostal muscles in the mid axillary posterior right lower ribs.  There is no rashes or skin discoloration to the area.  Lungs are clear.  Pain is clearly reproducible with palpation of these areas.  Also worse with movement.  Chest x-ray and rib x-ray obtained and is negative.  Most likely musculoskeletal pain.  No CVA tenderness.  No urinary symptoms.  No pleurisy.  No shortness of breath.  No risk factors for PE.  Will start on NSAIDs and muscle relaxants and have patient follow-up with family doctor.  Return precautions discussed.  Vitals:   03/08/18 0240 03/08/18 0250  BP: 125/75   Pulse: 80   Resp: 16   Temp: 97.9 F (36.6 C)   TempSrc: Oral   SpO2: 99%   Weight:  61.2 kg (135 lb)  Height:  5\' 10"  (1.778 m)     Final Clinical Impressions(s) / ED Diagnoses   Final diagnoses:  Rib pain on right side    ED Discharge Orders    None       Jaynie CrumbleKirichenko, Jacqulyne Gladue, PA-C 03/08/18 [id number]    Dione BoozeGlick, David, MD 03/08/18 [phone] [id number]

## 2018-03-08 NOTE — ED Triage Notes (Signed)
Patient with left sided chest pain that started 4 hours ago, patient was given 1 sl nitro and 324mg  ASA.  Patient had right sided chest pain yesterday.  No nausea or vomiting, does have some shortness of breath, but he is a smoker and has some ETOH on board.  Patient is CAOx4 on arrival to ED.

## 2018-03-08 NOTE — Discharge Instructions (Addendum)
I believe your pain is caused by a strain or spasms in your intercostal muscles. Take naprosyn as prescribed for pain and inflammation. Take flexeril for spasms. Avoid heavy lifting. Follow up with family doctor for recheck. Return if shortness of breath, fever, rash to the area, or any new concerning symptom

## 2018-03-08 NOTE — ED Triage Notes (Signed)
The pt is c/o rt lower   Rt lateral rib pain since this am.  No known injury  Worse after working today

## 2018-03-08 NOTE — ED Notes (Signed)
Patient transported to X-ray 

## 2018-03-08 NOTE — Consult Note (Signed)
CARDIOLOGY CONSULT NOTE   Referring Physician: Dr. Charm BargesButler Primary Physician: none listed Primary Cardiologist: Dr. Gala RomneyBensimhon Reason for Consultation: Code STEMI   HPI: [name] is a 57 yo man seen in consultation as a code STEMI at the request of Dr. Charm BargesButler. 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 He notes that he has had intermittent chest pain for several months, not related to exertion. Tonight he was with friends and noted the onset of severe left sided chest pain that had been lasting for the 4 hours prior to presentation. On initial EMS the pain was a 7/10, on my interview it had subsided to a 0/10. It is tight pain, nonradiating, located over the left pectoral region. The pain is associated with some dizzyness. Not associated with exertion or breathing. No cough, fever. No radiation. He endorses smoking tobacco and drinking this evening but specifically denies cocaine or other illicit drugs.   Of note he had a cardiac cath in 10/2016 which showed vasospasm in the setting of cocaine use. He denies any other cardiac history or risk factors. Review of systems otherwise negative.  His initial EKG was concerning for anteroseptal ST elevations vs. Repolarization abnormalities. Code STEMI called. On arrival to ER pain had resolved, ECG unchanged from prior.  Review of Systems:     Cardiac Review of Systems: {Y] = yes [ ]  = no  Chest Pain [ Viona GilmoreY-resolved   ]  Resting SOB [   ] Exertional SOB  [  ]  Orthopnea [  ]   Pedal Edema [   ]    Palpitations [  ] Syncope  [  ]   Presyncope [   ]  General Review of Systems: [Y] = yes [  ]=no Constitional: recent weight change [  ]; anorexia [  ]; fatigue [  ]; nausea [  ]; night sweats [  ]; fever [  ]; or chills [  ];                                                                     Eyes : blurred vision [  ]; diplopia [   ]; vision changes [  ];  Amaurosis fugax[  ]; Resp: cough [  ];  wheezing[   ];  hemoptysis[  ];  PND [  ];  GI:  gallstones[  ], vomiting[  ];  dysphagia[  ]; melena[  ];  hematochezia [  ]; heartburn[  ];   GU: kidney stones [  ]; hematuria[  ];   dysuria [  ];  nocturia[  ]; incontinence [  ];             Skin: rash, swelling[  ];, hair loss[  ];  peripheral edema[  ];  or itching[  ]; Musculosketetal: myalgias[  ];  joint swelling[  ];  joint erythema[  ];  joint pain[  ];  back pain[  ];  Heme/Lymph: bruising[  ];  bleeding[  ];  anemia[  ];  Neuro: TIA[  ];  headaches[  ];  stroke[  ];  vertigo[  ];  seizures[  ];   paresthesias[  ];  difficulty walking[  ];  Psych:depression[  ]; anxiety[  ];  Endocrine: diabetes[  ];  thyroid dysfunction[  ];  Other:  Past Medical History:  Diagnosis Date  . Anginal pain (HCC)      (Not in a hospital admission)  Infusions:   No Known Allergies  Social History   Socioeconomic History  . Marital status: Single    Spouse name: Not on file  . Number of children: Not on file  . Years of education: Not on file  . Highest education level: Not on file  Occupational History  . Not on file  Social Needs  . Financial resource strain: Not on file  . Food insecurity:    Worry: Not on file    Inability: Not on file  . Transportation needs:    Medical: Not on file    Non-medical: Not on file  Tobacco Use  . Smoking status: Current Every Day Smoker    Packs/day: 1.00  . Smokeless tobacco: Never Used  Substance and Sexual Activity  . Alcohol use: Yes    Alcohol/week: 3.6 oz    Types: 6 Cans of beer per week    Comment: current every day drinker  . Drug use: Yes    Types: Marijuana    Comment: today   . Sexual activity: Not on file  Lifestyle  . Physical activity:    Days per week: Not on file    Minutes per session: Not on file  . Stress: Not on file  Relationships  . Social connections:    Talks on phone: Not on file    Gets together: Not on file    Attends religious service: Not on file    Active  member of club or organization: Not on file    Attends meetings of clubs or organizations: Not on file    Relationship status: Not on file  . Intimate partner violence:    Fear of current or ex partner: Not on file    Emotionally abused: Not on file    Physically abused: Not on file    Forced sexual activity: Not on file  Other Topics Concern  . Not on file  Social History Narrative  . Not on file    No family history on file.  PHYSICAL EXAM: Vitals:   03/08/18 2130  BP: 118/83  Pulse: 79  Resp: 13  Temp: 97.9 F (36.6 C)  SpO2: 98%    No intake or output data in the 24 hours ending 03/08/18 2148  General:  Well appearing. No respiratory difficulty HEENT: normal Neck: supple. no JVD. Carotids 2+ bilat; no bruits. No lymphadenopathy or thryomegaly appreciated. Cor: PMI nondisplaced. Regular rate & rhythm. No rubs, gallops or murmurs. Lungs: clear Abdomen: soft, nontender, nondistended. No hepatosplenomegaly. No bruits or masses. Good bowel sounds. Extremities: no cyanosis, clubbing, rash, edema Neuro: alert & oriented x 3, cranial nerves grossly intact. moves all 4 extremities w/o difficulty. Affect pleasant.  ECG: sinus rhythm, evidence of PR depression and high voltage QRS similar to prior. EMS ECG concerning for ST elevation vs. repol in anteroseptal leads.  Results for orders placed or performed during the hospital encounter of 03/08/18 (from the past 24 hour(s))  I-stat troponin, ED     Status: None   Collection Time: 03/08/18  9:34 PM  Result Value Ref Range   Troponin i, poc 0.01 0.00 - 0.08 ng/mL   Comment 3          I-stat chem 8, ed  Status: Abnormal   Collection Time: 03/08/18  9:34 PM  Result Value Ref Range   Sodium 140 135 - 145 mmol/L   Potassium 4.5 3.5 - 5.1 mmol/L   Chloride 104 101 - 111 mmol/L   BUN 14 6 - 20 mg/dL   Creatinine, Ser 4.09 (H) 0.61 - 1.24 mg/dL   Glucose, Bld 811 (H) 65 - 99 mg/dL   Calcium, Ion 9.14 (L) 1.15 - 1.40 mmol/L    TCO2 25 22 - 32 mmol/L   Hemoglobin 15.6 13.0 - 17.0 g/dL   HCT 78.2 95.6 - 21.3 %   Dg Ribs Unilateral W/chest Right  Result Date: 03/08/2018 CLINICAL DATA:  Right lower lateral rib pain since this morning. EXAM: RIGHT RIBS AND CHEST - 3+ VIEW COMPARISON:  10/25/2016 CXR FINDINGS: No fracture or other bone lesions are seen involving the ribs. There is no evidence of pneumothorax or pleural effusion. Both lungs are clear. Heart size and mediastinal contours are within normal limits. Minimal degenerative spurring off the inferior glenoid and distal clavicle. IMPRESSION: No acute osseous abnormality of the right ribs. Electronically Signed   By: Tollie Eth M.D.   On: 03/08/2018 03:40   ASSESSMENT/PLAN: Mr. 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 Repolarization abnormalities. CODE STEMI activated, but chest pain and ECG normalized on arrival. First troponin negative, which in the setting of 4 hours of chest pain agrees with this not being an acute coronary syndrome.  Recommend monitoring, trend troponin for at least two cycles. Has history of coronary spasm in the past, if has episode of chest pain would be useful to capture 12 lead ECG to see if it is a similar presentation. He adamantly denied cocaine use to me, but this could be a trigger for pain. Was in ER last night for right rib pain, but this is a different pain. PR depression seen in a similar distribution, no positional symptoms, no cough/URI symptoms, symptoms present for several months so pericarditis lower on differential. No tachycardia or shortness of breath, again duration not suggestive of PE.  No indication for treatment of ACS. Please let us know if we can be of further assistance.  Jodelle Red, MD, PhD, overnight cardiology provider

## 2018-03-08 NOTE — ED Notes (Signed)
STEMI cancelled per Dr Clifton JamesMcAlhany.

## 2018-03-08 NOTE — ED Provider Notes (Signed)
[facility] EMERGENCY DEPARTMENT Provider Note   CSN: [id number] Arrival date & time: 03/08/18  2123     History   Chief Complaint Chief Complaint  Patient presents with  . Chest Pain  . Code STEMI    HPI Kenneth Leblanc is a 57 y.o. male.  He is arrives by EMS as a STEMI activation.  On and off left-sided chest pain for months but it was worse tonight as he was smoking and drinking with his friends.  States his been going on about 4 hours.  It is associated with some dizziness.  He has had this before.  He denies any cardiac history.  There is been no cough no shortness of breath no vomiting no diarrhea.  He denies any drugs and specifically denies any cocaine.  The history is provided by the patient and the EMS personnel.  Chest Pain   This is a recurrent problem. The current episode started 3 to 5 hours ago. The problem has not changed since onset.The pain is at a severity of 7/10. The quality of the pain is described as pressure-like. Duration of episode(s) is 4 hours. Associated symptoms include dizziness. Pertinent negatives include no abdominal pain, no back pain, no cough, no fever, no headaches, no palpitations, no shortness of breath, no syncope and no vomiting. He has tried nothing for the symptoms. The treatment provided no relief.  Pertinent negatives for past medical history include no seizures.    Past Medical History:  Diagnosis Date  . Anginal pain [facility])     Patient Active Problem List   Diagnosis Date Noted  . Unstable angina ([place]) 10/25/2016  . Tobacco use 10/25/2016    Past Surgical History:  Procedure Laterality Date  . ABDOMINAL SURGERY    . CARDIAC CATHETERIZATION N/A 10/26/2016   Procedure: Left Heart Cath and Coronary Angiography;  Surgeon: Jolaine Artist, MD;  Location: [place] CV [facility];  Service: Cardiovascular;  Laterality: N/A;        Home Medications    Prior to Admission medications   Medication Sig Start Date End  Date Taking? Authorizing Provider  aspirin 81 MG chewable tablet Chew 1 tablet (81 mg total) by mouth daily. Patient not taking: Reported on 03/08/2018 10/27/16   Barrett, Evelene Croon, PA-C  chlordiazePOXIDE (LIBRIUM) 25 MG capsule Take 2 capsules (50 mg total) by mouth 3 (three) times daily as needed for anxiety. 75m by mouth q 6 h day 1 590mby mouth q 8 h day 2 5010my mouth q 12 h day 3 25m39m mough QHS day 4 Patient not taking: Reported on 10/25/2016 09/03/16   TapiDelsa Grana-C  cyclobenzaprine (FLEXERIL) 10 MG tablet Take 1 tablet (10 mg total) by mouth 2 (two) times daily as needed for muscle spasms. 03/08/18   Kirichenko, TatyLahoma Rocker-C  loperamide (IMODIUM) 2 MG capsule Take 1 capsule (2 mg total) by mouth 4 (four) times daily as needed for diarrhea or loose stools. Patient not taking: Reported on 03/08/2018 01/04/17   Pisciotta, NicoElmyra Ricks-C  naproxen (NAPROSYN) 500 MG tablet Take 1 tablet (500 mg total) by mouth 2 (two) times daily. 03/08/18   Kirichenko, TatyLahoma Rocker-C  nitroGLYCERIN (NITROSTAT) 0.4 MG SL tablet Place 1 tablet (0.4 mg total) under the tongue every 5 (five) minutes as needed for chest pain. 10/26/16   Barrett, RhonEvelene Croon-C  ondansetron (ZOFRAN ODT) 4 MG disintegrating tablet Take 1 tablet (4 mg total) by mouth every 8 (eight) hours as  needed for nausea or vomiting. Patient not taking: Reported on 10/25/2016 09/03/16   Delsa Grana, PA-C  thiamine (VITAMIN B-1) 100 MG tablet Take 1 tablet (100 mg total) by mouth daily. Patient not taking: Reported on 10/25/2016 09/03/16   Delsa Grana, PA-C    Family History No family history on file.  Social History Social History   Tobacco Use  . Smoking status: Current Every Day Smoker    Packs/day: 1.00  . Smokeless tobacco: Never Used  Substance Use Topics  . Alcohol use: Yes    Alcohol/week: 3.6 oz    Types: 6 Cans of beer per week    Comment: current every day drinker  . Drug use: Yes    Types: Marijuana    Comment: today       Allergies   Patient has no known allergies.   Review of Systems Review of Systems  Constitutional: Negative for chills and fever.  HENT: Negative for ear pain and sore throat.   Eyes: Negative for pain and visual disturbance.  Respiratory: Negative for cough and shortness of breath.   Cardiovascular: Positive for chest pain. Negative for palpitations and syncope.  Gastrointestinal: Negative for abdominal pain and vomiting.  Genitourinary: Negative for dysuria and hematuria.  Musculoskeletal: Negative for arthralgias and back pain.  Skin: Negative for color change and rash.  Neurological: Positive for dizziness. Negative for seizures, syncope and headaches.  All other systems reviewed and are negative.    Physical Exam Updated Vital Signs BP 118/83   Pulse 79   Temp 97.9 F (36.6 C) (Temporal)   Resp 13   Ht _0  (1.778 m)   Wt 61.2 kg (135 lb)   SpO2 98%   BMI 19.37 kg/m   Physical Exam  Constitutional: He appears well-developed and well-nourished.  HENT:  Head: Normocephalic and atraumatic.  Eyes: Conjunctivae are normal.  Neck: Neck supple.  Cardiovascular: Normal rate and regular rhythm.  No murmur heard. Pulmonary/Chest: Effort normal and breath sounds normal. No respiratory distress.  Abdominal: Soft. There is no tenderness.  Musculoskeletal: He exhibits no edema.       Right lower leg: Normal. He exhibits no tenderness.       Left lower leg: Normal. He exhibits no tenderness.  Neurological: He is alert.  Skin: Skin is warm and dry.  Psychiatric: He has a normal mood and affect.  Nursing note and vitals reviewed.    ED Treatments / Results  Labs (all labs ordered are listed, but only abnormal results are displayed) Labs Reviewed  BASIC METABOLIC PANEL - Abnormal; Notable for the following components:      Result Value   Glucose, Bld 116 (*)    All other components within normal limits  I-STAT CHEM 8, ED - Abnormal; Notable for the following  components:   Creatinine, Ser 1.40 (*)    Glucose, Bld 113 (*)    Calcium, Ion 1.00 (*)    All other components within normal limits  CBC  I-STAT TROPONIN, ED  I-STAT TROPONIN, ED    EKG EKG Interpretation  Date/Time:  Saturday March 08 2018 21:28:33 EDT Ventricular Rate:  74 PR Interval:    QRS Duration: 92 QT Interval:  413 QTC Calculation: 459 R Axis:   74 Text Interpretation:  Sinus rhythm Probable left atrial enlargement Probable anteroseptal infarct, old ST elevation suggests acute pericarditis Confirmed by Aletta Edouard [phone]) on 03/08/2018 9:45:53 PM Also confirmed by Aletta Edouard [phone]), editor Beaver Marsh, Jeannetta Nap [phone])  on 03/09/2018  9:48:17 AM   Radiology Dg Ribs Unilateral W/chest Right  Result Date: 03/08/2018 CLINICAL DATA:  Right lower lateral rib pain since this morning. EXAM: RIGHT RIBS AND CHEST - 3+ VIEW COMPARISON:  10/25/2016 CXR FINDINGS: No fracture or other bone lesions are seen involving the ribs. There is no evidence of pneumothorax or pleural effusion. Both lungs are clear. Heart size and mediastinal contours are within normal limits. Minimal degenerative spurring off the inferior glenoid and distal clavicle. IMPRESSION: No acute osseous abnormality of the right ribs. Electronically Signed   By: Ashley Royalty M.D.   On: 03/08/2018 03:40    Procedures Procedures (including critical care time)  Medications Ordered in ED Medications - No data to display   Initial Impression / Assessment and Plan / ED Course  I have reviewed the triage vital signs and the nursing notes.  Pertinent labs & imaging results that were available during my care of the patient were reviewed by me and considered in my medical decision making (see chart for details).  Clinical Course as of Mar 10 1124  Sat Mar 08, 2018  2144 Patient was initially STEMI activation.  Patient was evaluated by cardiology in the department and did not felt this met criteria for going to the Cath Lab.   On review patient had a similar but worse looking EKG in 2017 again and did not find significant disease.  EKG changes were attributed to cocaine.   [MB]  2214 Currently patient has 0 out of 10 chest pain and is resting comfortably.   [MB]    Clinical Course User Index [MB] Hayden Rasmussen, MD     Final Clinical Impressions(s) / ED Diagnoses   Final diagnoses:  Precordial pain    ED Discharge Orders    None       Hayden Rasmussen, MD 03/10/18 1126

## 2018-03-08 NOTE — Discharge Instructions (Signed)
Your evaluated in the emergency department for some left-sided chest pain.  Pain improved while you were here.  We did not find any obvious signs of any heart injury.  Your chest x-ray also looked okay.  You should follow-up with your doctor for further evaluation.

## 2018-03-09 LAB — I-STAT TROPONIN, ED: Troponin i, poc: 0 ng/mL (ref 0.00–0.08)

## 2018-03-09 NOTE — ED Notes (Signed)
ED Provider at bedside. 

## 2018-03-09 NOTE — ED Provider Notes (Signed)
I assumed care in sign out to follow-up on repeat troponin.  Troponin negative.  I reviewed the previous EKG.  He had been seen by cardiology on arrival, and there was no STEMI.  Plan at time of signout was to discharge home with normal troponin.  Patient reports he feels improved.  Denies any active chest pain.  Denies any pleuritic chest pain.  Vital signs reviewed.  Will discharge home.  We discussed strict return precautions   Zadie RhineWickline, Dionta Larke, MD 03/09/18 [phone]

## 2018-03-10 ENCOUNTER — Encounter (HOSPITAL_COMMUNITY): Payer: Self-pay | Admitting: Emergency Medicine

## 2018-03-10 ENCOUNTER — Emergency Department (HOSPITAL_COMMUNITY)
Admission: EM | Admit: 2018-03-10 | Discharge: 2018-03-11 | Disposition: A | Discharge status: Home / Self Care | Payer: Self-pay | Attending: Emergency Medicine | Admitting: Emergency Medicine

## 2018-03-10 ENCOUNTER — Other Ambulatory Visit: Payer: Self-pay

## 2018-03-10 DIAGNOSIS — R0781 Pleurodynia: Secondary | ICD-10-CM | POA: Insufficient documentation

## 2018-03-10 DIAGNOSIS — Z79899 Other long term (current) drug therapy: Secondary | ICD-10-CM | POA: Insufficient documentation

## 2018-03-10 DIAGNOSIS — F17200 Nicotine dependence, unspecified, uncomplicated: Secondary | ICD-10-CM | POA: Insufficient documentation

## 2018-03-10 DIAGNOSIS — I252 Old myocardial infarction: Secondary | ICD-10-CM | POA: Insufficient documentation

## 2018-03-10 NOTE — ED Triage Notes (Signed)
C/o right sided abdominal pain X1 week.  Denies n/v/diarrhia.

## 2018-03-11 ENCOUNTER — Other Ambulatory Visit: Payer: Self-pay

## 2018-03-11 ENCOUNTER — Emergency Department (HOSPITAL_COMMUNITY): Payer: Self-pay

## 2018-03-11 LAB — COMPREHENSIVE METABOLIC PANEL
ALBUMIN: 3.8 g/dL (ref 3.5–5.0)
ALK PHOS: 82 U/L (ref 38–126)
ALT: 34 U/L (ref 17–63)
AST: 39 U/L (ref 15–41)
Anion gap: 9 (ref 5–15)
BILIRUBIN TOTAL: 0.6 mg/dL (ref 0.3–1.2)
BUN: 8 mg/dL (ref 6–20)
CALCIUM: 8.8 mg/dL — AB (ref 8.9–10.3)
CO2: 24 mmol/L (ref 22–32)
Chloride: 105 mmol/L (ref 101–111)
Creatinine, Ser: 0.98 mg/dL (ref 0.61–1.24)
GFR calc Af Amer: >60 mL/min (ref >60)
GFR calc non Af Amer: >60 mL/min (ref >60)
Glucose, Bld: 94 mg/dL (ref 65–99)
Potassium: 4.4 mmol/L (ref 3.5–5.1)
Sodium: 138 mmol/L (ref 135–145)
TOTAL PROTEIN: 7.3 g/dL (ref 6.5–8.1)

## 2018-03-11 LAB — URINALYSIS, ROUTINE W REFLEX MICROSCOPIC
Bacteria, UA: NONE SEEN
Bilirubin Urine: NEGATIVE
GLUCOSE, UA: NEGATIVE mg/dL
Ketones, ur: NEGATIVE mg/dL
Leukocytes, UA: NEGATIVE
NITRITE: NEGATIVE
PH: 5 (ref 5.0–8.0)
Protein, ur: NEGATIVE mg/dL
SPECIFIC GRAVITY, URINE: 1.017 (ref 1.005–1.030)

## 2018-03-11 LAB — CBC
HCT: 39.9 % (ref 39.0–52.0)
Hemoglobin: 13.5 g/dL (ref 13.0–17.0)
MCH: 32.1 pg (ref 26.0–34.0)
MCHC: 33.8 g/dL (ref 30.0–36.0)
MCV: 95 fL (ref 78.0–100.0)
PLATELETS: 313 10*3/uL (ref 150–400)
RBC: 4.2 MIL/uL — ABNORMAL LOW (ref 4.22–5.81)
RDW: 15.1 % (ref 11.5–15.5)
WBC: 7.3 10*3/uL (ref 4.0–10.5)

## 2018-03-11 LAB — LIPASE, BLOOD: Lipase: 18 U/L (ref 11–51)

## 2018-03-11 MED ORDER — KETOROLAC TROMETHAMINE 30 MG/ML IJ SOLN
30.0000 mg | Freq: Once | INTRAMUSCULAR | Status: AC
  Administered 2018-03-11: 30 mg via INTRAMUSCULAR
  Filled 2018-03-11: qty 1

## 2018-03-11 MED ORDER — METHOCARBAMOL 500 MG PO TABS: 500.0000 mg | ORAL_TABLET | Freq: Two times a day (BID) | ORAL | 0 refills | Status: AC

## 2018-03-11 MED ORDER — NAPROXEN 500 MG PO TABS: 500.0000 mg | ORAL_TABLET | Freq: Two times a day (BID) | ORAL | 0 refills | Status: AC

## 2018-03-11 NOTE — ED Provider Notes (Signed)
[facility] Provider Note   CSN: [id number] Arrival date & time: 03/10/18  2238     History   Chief Complaint Chief Complaint  Patient presents with  . Abdominal Pain    HPI Lucas MallowJohnnie Nancarrow is a [age] y.o. male with history of alcohol abuse who presents with a 1-2-week history of right rib pain.  Patient was seen 3 days ago and diagnosed with precordial pain.  He was discharged with Naprosyn and Flexeril, which he has not filled.  Patient's pain is sharp and worse with movement.  It is occasionally worse with breathing, but not usually.  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  He is right-handed.  He denies any neck or significant back pain.  He denies any urinary symptoms.  He denies any abdominal pain, nausea, vomiting, diarrhea, fevers.  HPI  Past Medical History:  Diagnosis Date  . Anginal pain [facility](HCC)     Patient Active Problem List   Diagnosis Date Noted  . Electrocardiogram suggestive of ST elevation myocardial infarction (STEMI) ([facility])   . Chest pain in adult   . Unstable angina ([facility]) 10/25/2016  . Tobacco abuse 10/25/2016    Past Surgical History:  Procedure Laterality Date  . ABDOMINAL SURGERY    . CARDIAC CATHETERIZATION N/A 10/26/2016   Procedure: Left Heart Cath and Coronary Angiography;  Surgeon: Dolores Pattyaniel R Bensimhon, MD;  [facility];  Service: Cardiovascular;  Laterality: N/A;        Home Medications    Prior to Admission medications   Medication Sig Start Date End Date Taking? Authorizing Provider  nitroGLYCERIN (NITROSTAT) 0.4 MG SL tablet Place 1 tablet (0.4 mg total) under the tongue every 5 (five) minutes as needed for chest pain. 10/26/16  Yes Barrett, Joline SaltRhonda G, PA-C  aspirin 81 MG chewable tablet Chew 1 tablet (81 mg total) by mouth daily. Patient not taking: Reported on 03/08/2018 10/27/16   Barrett, Joline Salthonda G, PA-C  chlordiazePOXIDE (LIBRIUM) 25 MG capsule Take 2 capsules (50 mg  total) by mouth 3 (three) times daily as needed for anxiety. 50mg  by mouth q 6 h day 1 50mg  by mouth q 8 h day 2 50mg  by mouth q 12 h day 3 50mg  by mough QHS day 4 Patient not taking: Reported on 10/25/2016 09/03/16   Danelle Berryapia, Leisa, PA-C  methocarbamol (ROBAXIN) 500 MG tablet Take 1 tablet (500 mg total) by mouth 2 (two) times daily. 03/11/18   Timon Geissinger, Waylan BogaAlexandra M, PA-C  naproxen (NAPROSYN) 500 MG tablet Take 1 tablet (500 mg total) by mouth 2 (two) times daily. 03/11/18   Kahlia Lagunes, Waylan BogaAlexandra M, PA-C  thiamine (VITAMIN B-1) 100 MG tablet Take 1 tablet (100 mg total) by mouth daily. Patient not taking: Reported on 10/25/2016 09/03/16   Danelle Berryapia, Leisa, PA-C    Family History No family history on file.  Social History Social History   Tobacco Use  . Smoking status: Current Every Day Smoker    Packs/day: 1.00  . Smokeless tobacco: Never Used  Substance Use Topics  . Alcohol use: Yes    Alcohol/week: 3.6 oz    Types: 6 Cans of beer per week    Comment: current every day drinker  . Drug use: Yes    Types: Marijuana    Comment: today      Allergies   Patient has no known allergies.   Review of Systems Review of Systems  Constitutional: Negative for chills and fever.  HENT:  Negative for facial swelling and sore throat.   Respiratory: Negative for shortness of breath.   Cardiovascular: Negative for chest pain.  Gastrointestinal: Negative for abdominal pain, diarrhea, nausea and vomiting.  Genitourinary: Positive for flank pain (R). Negative for dysuria.  Musculoskeletal: Negative for back pain.  Skin: Negative for rash and wound.  Neurological: Negative for headaches.  Psychiatric/Behavioral: The patient is not nervous/anxious.      Physical Exam Updated Vital Signs BP (!) 133/93 (BP Location: Right Arm)   Pulse 93   Temp 98.3 F (36.8 C) (Oral)   Resp 18   Ht 5\' 10"  (1.778 m)   Wt 62.1 kg (137 lb)   SpO2 98%   BMI 19.66 kg/m   Physical Exam  Constitutional: He appears  well-developed and well-nourished. No distress.  HENT:  Head: Normocephalic and atraumatic.  Mouth/Throat: Oropharynx is clear and moist. No oropharyngeal exudate.  Eyes: Pupils are equal, round, and reactive to light. Conjunctivae are normal. Right eye exhibits no discharge. Left eye exhibits no discharge. No scleral icterus.  Neck: Normal range of motion. Neck supple. No thyromegaly present.  Cardiovascular: Normal rate, regular rhythm, normal heart sounds and intact distal pulses. Exam reveals no gallop and no friction rub.  No murmur heard. Pulmonary/Chest: Effort normal and breath sounds normal. No stridor. No respiratory distress. He has no wheezes. He has no rales.        Abdominal: Soft. Bowel sounds are normal. He exhibits no distension. There is no tenderness. There is no rebound, no guarding and no CVA tenderness.  Musculoskeletal: He exhibits no edema.  Lymphadenopathy:    He has no cervical adenopathy.  Neurological: He is alert. Coordination normal.  Skin: Skin is warm and dry. No rash noted. He is not diaphoretic. No pallor.  Psychiatric: He has a normal mood and affect.  Nursing note and vitals reviewed.    ED Treatments / Results  Labs (all labs ordered are listed, but only abnormal results are displayed) Labs Reviewed  COMPREHENSIVE METABOLIC PANEL - Abnormal; Notable for the following components:      Result Value   Calcium 8.8 (*)    All other components within normal limits  CBC - Abnormal; Notable for the following components:   RBC 4.20 (*)    All other components within normal limits  URINALYSIS, ROUTINE W REFLEX MICROSCOPIC - Abnormal; Notable for the following components:   Hgb urine dipstick SMALL (*)    Squamous Epithelial / LPF 0-5 (*)    All other components within normal limits  LIPASE, BLOOD    EKG None  Radiology Ct Renal Stone Study  Result Date: 03/11/2018 CLINICAL DATA:  Right abdominal/flank pain for approximately 1 week. Hematuria  EXAM: CT ABDOMEN AND PELVIS WITHOUT CONTRAST TECHNIQUE: Multidetector CT imaging of the abdomen and pelvis was performed following the standard protocol without oral or IV contrast. COMPARISON:  None. FINDINGS: Lower chest: There is scarring in the medial segment of the right lower lobe. There is slight lower lobe bronchiectatic change bilaterally. Hepatobiliary: No focal liver lesions are apparent on this noncontrast enhanced study. Gallbladder wall is not appreciably thickened. There is no biliary duct dilatation. Pancreas: There is calcification throughout the pancreas diffusely consistent with chronic pancreatitis. There is currently no peripancreatic fluid or stranding. No pancreatic mass or duct dilatation. No pancreatic edema. Spleen: No splenic lesions are evident. Adrenals/Urinary Tract: Adrenals bilaterally appear unremarkable. Kidneys bilaterally show no evident mass or hydronephrosis on either side. There is no appreciable renal  or ureteral calculus on either side. Urinary bladder is midline with wall thickness within normal limits. Stomach/Bowel: There is moderate stool in the colon. There is no appreciable bowel wall or mesenteric thickening. No evident bowel obstruction. No free air or portal venous air. Vascular/Lymphatic: There is atherosclerotic calcification in the aorta and common iliac arteries. No aneurysm evident. Major mesenteric vessels appear patent on this noncontrast enhanced study. No adenopathy is demonstrable in the abdomen or pelvis. Reproductive: Prostate and seminal vesicles appear unremarkable with respect to size and contour. No pelvic mass evident. Other: The appendix is located slightly lateral to the inferior aspect of the right kidney. Appendix appears unremarkable. No abscess or ascites evident in the abdomen or pelvis. Musculoskeletal: There are no evident blastic or lytic bone lesions. There is no intramuscular or abdominal wall lesion. IMPRESSION: 1. No hydronephrosis. No  renal or ureteral calculus on either side. 2. Diffuse pancreatic calcification consistent with chronic pancreatitis. No changes suggesting acute pancreatitis evident on this study. 3. No bowel obstruction. No abscess in the abdomen pelvis. Appendix appears normal. 4.  Aortoiliac atherosclerosis. 5. Slight lower lobe bronchiectatic change bilaterally. Scarring medial segment right lower lobe Aortic Atherosclerosis (ICD10-I70.0). Electronically Signed   By: Bretta Bang III M.D.   On: 03/11/2018 08:12    Procedures Procedures (including critical care time)  Medications Ordered in ED Medications  ketorolac (TORADOL) 30 MG/ML injection 30 mg (30 mg Intramuscular Given 03/11/18 0715)     Initial Impression / Assessment and Plan / ED Course  I have reviewed the triage vital signs and the nursing notes.  Pertinent labs & imaging results that were available during my care of the patient were reviewed by me and considered in my medical decision making (see chart for details).     Patient with suspected musculoskeletal rib pain.  His symptoms are worse on palpation movement, non-pleuritic.  He denies any shortness of breath or chest pain.  He has stable vitals.  Doubt PE.  CBC, CMP, lipase unremarkable.  UA does show small hematuria.  CT renal stone study shows evidence of chronic pancreatitis, but no acute pancreatitis.  No other acute findings.  Patient feeling much better after Toradol.  Will continue to advise ice, heat, Naprosyn, Robaxin.  Patient given a few days off work to rest considering symptoms most likely related to repetitive use of right upper extremity.  Follow-up to PCP for recheck of urine in 1-2 weeks and if symptoms are not improving.  Return precautions discussed.  Patient understands and agrees with plan.  Patient vitals stable and discharged in satisfactory condition.  Final Clinical Impressions(s) / ED Diagnoses   Final diagnoses:  Rib pain on right side    ED Discharge  Orders        Ordered    naproxen (NAPROSYN) 500 MG tablet  2 times daily     03/11/18 0858    methocarbamol (ROBAXIN) 500 MG tablet  2 times daily     03/11/18 0858       Emi Holes, PA-C 03/11/18 0923    Melene Plan, DO 03/11/18 1604

## 2018-03-11 NOTE — Discharge Instructions (Signed)
Medications: Naprosyn, Robaxin  Treatment: Take Naprosyn twice daily as prescribed.  Take Robaxin twice daily as needed for muscle pain and spasms.  Do not drive or operate machinery while taking Robaxin, as this can make you sleepy.  Use ice and heat alternating 20 minutes on, 20 minutes off.  Rest your upper body for the next few days as best as you can.  Increase the amount of water you are drinking.  Follow-up: Please follow-up with your doctor in 1-2 weeks for recheck of your urine.  Please also follow-up with your doctor if your symptoms are not improving.  Please return to the emergency department if you develop any new or worsening symptoms.

## 2018-05-24 ENCOUNTER — Emergency Department (HOSPITAL_COMMUNITY)
Admission: EM | Admit: 2018-05-24 | Discharge: 2018-05-24 | Disposition: A | Discharge status: Home / Self Care | Payer: Self-pay | Attending: Emergency Medicine | Admitting: Emergency Medicine

## 2018-05-24 ENCOUNTER — Encounter (HOSPITAL_COMMUNITY): Payer: Self-pay

## 2018-05-24 ENCOUNTER — Other Ambulatory Visit: Payer: Self-pay

## 2018-05-24 DIAGNOSIS — F10920 Alcohol use, unspecified with intoxication, uncomplicated: Secondary | ICD-10-CM | POA: Insufficient documentation

## 2018-05-24 DIAGNOSIS — F17210 Nicotine dependence, cigarettes, uncomplicated: Secondary | ICD-10-CM | POA: Insufficient documentation

## 2018-05-24 HISTORY — DX: Alcohol abuse, uncomplicated: F10.10

## 2018-05-24 HISTORY — DX: Cannabis use, unspecified, uncomplicated: F12.90

## 2018-05-24 MED ORDER — SODIUM CHLORIDE 0.9 % IV BOLUS: 1000.0000 mL | Freq: Once | INTRAVENOUS | Status: DC

## 2018-05-24 NOTE — ED Notes (Signed)
Bed: [facility] Expected date:  Expected time:  Means of arrival:  Comments: 

## 2018-05-24 NOTE — ED Notes (Signed)
Before RN could even triage pt, pt began threatening and cussing at staff. Pt is screaming and disturbing other patients. [employer] and security at bedside
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
## 2018-05-24 NOTE — ED Provider Notes (Signed)
[facility]-EMERGENCY DEPT Provider Note   CSN: [id number] Arrival date & time: 05/24/18  1330     History   Chief Complaint Chief Complaint  Patient presents with  . Alcohol Intoxication    HPI Kenneth Leblanc is a 57 y.o. male.  Pt presents to the ED today with alcohol intoxication.  [place] was not acting right.  He tripped getting off the bus, but did not hit his head.  Pt said he did not feel wall and wanted to come to the hospital.  When pt arrived, he said he just wanted to be left alone and cursed at staff.  He was told by me that if he did not stop cursing at people, he could go home.  He was respectful after that.  He denies any pain.  Pt does admit to drinking today.  He denies drugs, but MJ was found on him by EMS.     Past Medical History:  Diagnosis Date  . Anginal pain (HCC)   . ETOH abuse   . Marijuana use     Patient Active Problem List   Diagnosis Date Noted  . Electrocardiogram suggestive of ST elevation myocardial infarction (STEMI) (HCC)   . Chest pain in adult   . Unstable angina (HCC) 10/25/2016  . Tobacco abuse 10/25/2016    Past Surgical History:  Procedure Laterality Date  . ABDOMINAL SURGERY    . CARDIAC CATHETERIZATION N/A 10/26/2016   Procedure: Left Heart Cath and Coronary Angiography;  Surgeon: Dolores Patty, MD;  Location: [facility] INVASIVE CV LAB;  Service: Cardiovascular;  Laterality: N/A;        Home Medications    Prior to Admission medications   Medication Sig Start Date End Date Taking? Authorizing Provider  aspirin 81 MG chewable tablet Chew 1 tablet (81 mg total) by mouth daily. Patient not taking: Reported on 03/08/2018 10/27/16   Barrett, Joline Salt, PA-C  chlordiazePOXIDE (LIBRIUM) 25 MG capsule Take 2 capsules (50 mg total) by mouth 3 (three) times daily as needed for anxiety. 50mg  by mouth q 6 h day 1 50mg  by mouth q 8 h day 2 50mg  by mouth q 12 h day 3 50mg  by mough  QHS day 4 Patient not taking: Reported on 10/25/2016 09/03/16   Danelle Berry, PA-C  methocarbamol (ROBAXIN) 500 MG tablet Take 1 tablet (500 mg total) by mouth 2 (two) times daily. 03/11/18   Law, Waylan Boga, PA-C  naproxen (NAPROSYN) 500 MG tablet Take 1 tablet (500 mg total) by mouth 2 (two) times daily. 03/11/18   Law, Waylan Boga, PA-C  nitroGLYCERIN (NITROSTAT) 0.4 MG SL tablet Place 1 tablet (0.4 mg total) under the tongue every 5 (five) minutes as needed for chest pain. 10/26/16   Barrett, Joline Salt, PA-C  thiamine (VITAMIN B-1) 100 MG tablet Take 1 tablet (100 mg total) by mouth daily. Patient not taking: Reported on 10/25/2016 09/03/16   Danelle Berry, PA-C    Family History No family history on file.  Social History Social History   Tobacco Use  . Smoking status: Current Every Day Smoker    Packs/day: 1.00  . Smokeless tobacco: Never Used  Substance Use Topics  . Alcohol use: Yes    Alcohol/week: 3.6 oz    Types: 6 Cans of beer per week    Comment: current every day drinker  . Drug use: Yes    Types: Marijuana    Comment: today  Allergies   Patient has no known allergies.   Review of Systems Review of Systems  All other systems reviewed and are negative.    Physical Exam Updated Vital Signs Ht 5\' 10"  (1.778 m)   Wt 62.1 kg (137 lb)   BMI 19.66 kg/m   Physical Exam  Constitutional: He is oriented to person, place, and time. He appears well-developed and well-nourished.  Pt smells of alcohol  HENT:  Head: Normocephalic and atraumatic.  Right Ear: External ear normal.  Left Ear: External ear normal.  Nose: Nose normal.  Mouth/Throat: Oropharynx is clear and moist.  Eyes: Pupils are equal, round, and reactive to light. Conjunctivae and EOM are normal.  Neck: Normal range of motion. Neck supple.  Cardiovascular: Normal rate, regular rhythm, normal heart sounds and intact distal pulses.  Pulmonary/Chest: Effort normal and breath sounds normal.  Abdominal:  Soft. Bowel sounds are normal.  Musculoskeletal: Normal range of motion.  Neurological: He is alert and oriented to person, place, and time.  Skin: Skin is warm. Capillary refill takes less than 2 seconds.  Psychiatric: His affect is angry. He is agitated. He expresses impulsivity and inappropriate judgment.  Nursing note and vitals reviewed.    ED Treatments / Results  Labs (all labs ordered are listed, but only abnormal results are displayed) Labs Reviewed  COMPREHENSIVE METABOLIC PANEL  ETHANOL  CBC WITH DIFFERENTIAL/PLATELET  URINALYSIS, ROUTINE W REFLEX MICROSCOPIC  MAGNESIUM  RAPID URINE DRUG SCREEN, HOSP PERFORMED    EKG None  Radiology No results found.  Procedures Procedures (including critical care time)  Medications Ordered in ED Medications  sodium chloride 0.9 % bolus 1,000 mL (has no administration in time range)     Initial Impression / Assessment and Plan / ED Course  I have reviewed the triage vital signs and the nursing notes.  Pertinent labs & imaging results that were available during my care of the patient were reviewed by me and considered in my medical decision making (see chart for details).   Pt's nurse placed an IV in pt which he tore out.  The pt does not want any treatment, so he was d/c home.  Although intoxicated, he is awake and alert and able to make his own decisions.  The pt was able to ambulate out of the ED.   Final Clinical Impressions(s) / ED Diagnoses   Final diagnoses:  Alcoholic intoxication without complication [facility](HCC)    ED Discharge Orders    None       Jacalyn LefevreHaviland, Ellie Spickler, MD 05/24/18 1500

## 2018-05-24 NOTE — ED Notes (Signed)
RN put IV in patient and patient pulled out IV after using restroom and told RN the "You aint getting shit out of me" and pulled IV out and laid back down in bed.

## 2018-06-05 ENCOUNTER — Emergency Department (HOSPITAL_COMMUNITY)
Admission: EM | Admit: 2018-06-05 | Discharge: 2018-06-05 | Disposition: A | Discharge status: Home / Self Care | Payer: Self-pay | Attending: Emergency Medicine | Admitting: Emergency Medicine

## 2018-06-05 ENCOUNTER — Other Ambulatory Visit: Payer: Self-pay

## 2018-06-05 ENCOUNTER — Encounter (HOSPITAL_COMMUNITY): Payer: Self-pay

## 2018-06-05 DIAGNOSIS — M25511 Pain in right shoulder: Secondary | ICD-10-CM | POA: Insufficient documentation

## 2018-06-05 DIAGNOSIS — Z7982 Long term (current) use of aspirin: Secondary | ICD-10-CM | POA: Insufficient documentation

## 2018-06-05 DIAGNOSIS — F17210 Nicotine dependence, cigarettes, uncomplicated: Secondary | ICD-10-CM | POA: Insufficient documentation

## 2018-06-05 DIAGNOSIS — Z79899 Other long term (current) drug therapy: Secondary | ICD-10-CM | POA: Insufficient documentation

## 2018-06-05 DIAGNOSIS — G8929 Other chronic pain: Secondary | ICD-10-CM | POA: Insufficient documentation

## 2018-06-05 DIAGNOSIS — Z5321 Procedure and treatment not carried out due to patient leaving prior to being seen by health care provider: Secondary | ICD-10-CM | POA: Insufficient documentation

## 2018-06-05 NOTE — ED Triage Notes (Signed)
Pt c/o right sided shoulder pain, reports repetitive movement at work.

## 2018-06-05 NOTE — Discharge Instructions (Addendum)
Take it easy, but do not lay around too much as this may make any stiffness worse.  Antiinflammatory medications: Take 600 mg of ibuprofen every 6 hours or 440 mg (over the counter dose) to 500 mg (prescription dose) of naproxen every 12 hours for the next 3 days. After this time, these medications may be used as needed for pain. Take these medications with food to avoid upset stomach. Choose only one of these medications, do not take them together.  Tylenol: Should you continue to have additional pain while taking the ibuprofen or naproxen, you may add in tylenol as needed. Your daily total maximum amount of tylenol from all sources should be limited to 4000mg /day for persons without liver problems, or 2000mg /day for those with liver problems. Muscle relaxer: Robaxin was prescribed to you last time.  Robaxin is a muscle relaxer and may help loosen stiff muscles. Do not take the Robaxin while driving or performing other dangerous activities.  Lidocaine patches: These are available via either prescription or over-the-counter. The over-the-counter option may be more economical one and are likely just as effective. There are multiple over-the-counter brands, such as Salonpas. Exercises: Be sure to perform the attached exercises starting with three times a week and working up to performing them daily. This is an essential part of preventing long term problems.  Follow up: Follow up with a primary care provider for any future management of these complaints. Be sure to follow up within 7-10 days.  May also follow-up with an orthopedic specialist. Return: Return to the ED should you have worsening pain, loss of sensation, loss of function, or any other major concerns.

## 2018-06-05 NOTE — ED Notes (Signed)
Upon entering pt room, pt no longer in room and pt labels in trash

## 2018-06-05 NOTE — ED Triage Notes (Signed)
Pt to ER for evaluation of right shoulder/neck pain. Reports has been told it is muscle spasms, denies any OTC medications. Pt in NAD.

## 2018-06-05 NOTE — ED Provider Notes (Signed)
[facility] EMERGENCY DEPARTMENT Provider Note   CSN: [id number] Arrival date & time: 06/05/18  1456     History   Chief Complaint Chief Complaint  Patient presents with  . Shoulder Pain    HPI Kenneth Leblanc is a 57 y.o. male.  HPI   Kenneth Leblanc is a 57 y.o. male, with a history of EtOH and marijuana use, presenting to the ED with acute on chronic right shoulder pain recurring about 4 days ago.  Patient does multiple repetitive motions using his upper body as part of his job.  He began to have pain again in the right shoulder following 1 of his shifts.  Pain is described as a tightness and soreness, moderate, nonradiating.  He has been seen for a previous occurrence of this pain, diagnosed with muscle spasm, but did not try any of the treatment prescribed.  He has not tried any medications for his complaint stating, "I do not like to take medication."  Denies falls/trauma, numbness, weakness, fever, or any other complaints.      Past Medical History:  Diagnosis Date  . Anginal pain (HCC)   . ETOH abuse   . Marijuana use     Patient Active Problem List   Diagnosis Date Noted  . Electrocardiogram suggestive of ST elevation myocardial infarction (STEMI) (HCC)   . Chest pain in adult   . Unstable angina (HCC) 10/25/2016  . Tobacco abuse 10/25/2016    Past Surgical History:  Procedure Laterality Date  . ABDOMINAL SURGERY    . CARDIAC CATHETERIZATION N/A 10/26/2016   Procedure: Left Heart Cath and Coronary Angiography;  Surgeon: Dolores Patty, MD;  Location: [facility] INVASIVE CV LAB;  Service: Cardiovascular;  Laterality: N/A;        Home Medications    Prior to Admission medications   Medication Sig Start Date End Date Taking? Authorizing Provider  aspirin 81 MG chewable tablet Chew 1 tablet (81 mg total) by mouth daily. Patient not taking: Reported on 03/08/2018 10/27/16   Barrett, Joline Salt, PA-C  chlordiazePOXIDE (LIBRIUM) 25 MG capsule Take 2  capsules (50 mg total) by mouth 3 (three) times daily as needed for anxiety. 50mg  by mouth q 6 h day 1 50mg  by mouth q 8 h day 2 50mg  by mouth q 12 h day 3 50mg  by mough QHS day 4 Patient not taking: Reported on 10/25/2016 09/03/16   Danelle Berry, PA-C  methocarbamol (ROBAXIN) 500 MG tablet Take 1 tablet (500 mg total) by mouth 2 (two) times daily. 03/11/18   Law, Waylan Boga, PA-C  naproxen (NAPROSYN) 500 MG tablet Take 1 tablet (500 mg total) by mouth 2 (two) times daily. 03/11/18   Law, Waylan Boga, PA-C  nitroGLYCERIN (NITROSTAT) 0.4 MG SL tablet Place 1 tablet (0.4 mg total) under the tongue every 5 (five) minutes as needed for chest pain. 10/26/16   Barrett, Joline Salt, PA-C  thiamine (VITAMIN B-1) 100 MG tablet Take 1 tablet (100 mg total) by mouth daily. Patient not taking: Reported on 10/25/2016 09/03/16   Danelle Berry, PA-C    Family History No family history on file.  Social History Social History   Tobacco Use  . Smoking status: Current Every Day Smoker    Packs/day: 1.00  . Smokeless tobacco: Never Used  Substance Use Topics  . Alcohol use: Yes    Alcohol/week: 3.6 oz    Types: 6 Cans of beer per week    Comment: current every day drinker  .  Drug use: Yes    Types: Marijuana    Comment: today      Allergies   Patient has no known allergies.   Review of Systems Review of Systems  Constitutional: Negative for fever.  Musculoskeletal: Positive for arthralgias. Negative for joint swelling.  Neurological: Negative for weakness and numbness.     Physical Exam Updated Vital Signs BP 113/66 (BP Location: Right Arm)   Pulse 84   Temp 98.7 F (37.1 C) (Oral)   SpO2 99%   Physical Exam  Constitutional: He appears well-developed and well-nourished. No distress.  HENT:  Head: Normocephalic and atraumatic.  Eyes: Conjunctivae are normal.  Neck: Neck supple.  Cardiovascular: Normal rate, regular rhythm and intact distal pulses.  Pulmonary/Chest: Effort normal.    Musculoskeletal: He exhibits tenderness.  Tenderness along the right trapezius.  No tenderness or pain noted in the actual shoulder joint.  No swelling, bruising, erythema, or other abnormality noted. Full range of motion without difficulty in the right shoulder. Normal motor function intact in both upper extremities. No midline spinal tenderness.    Neurological: He is alert.  Sensation grossly intact to light touch through each of the nerve distributions of the bilateral upper extremities. Abduction and adduction of the fingers intact against resistance. Grip strength equal bilaterally. Supination and pronation intact against resistance. Strength 5/5 through the cardinal directions of the bilateral wrists. Strength 5/5 with flexion and extension of the bilateral elbows. Patient can touch the thumb to each one of the fingertips without difficulty.   Skin: Skin is warm and dry. Capillary refill takes less than 2 seconds. He is not diaphoretic. No pallor.  Psychiatric: He has a normal mood and affect. His behavior is normal.  Nursing note and vitals reviewed.    ED Treatments / Results  Labs (all labs ordered are listed, but only abnormal results are displayed) Labs Reviewed - No data to display  EKG None  Radiology No results found.  Procedures Procedures (including critical care time)  Medications Ordered in ED Medications - No data to display   Initial Impression / Assessment and Plan / ED Course  I have reviewed the triage vital signs and the nursing notes.  Pertinent labs & imaging results that were available during my care of the patient were reviewed by me and considered in my medical decision making (see chart for details).     Patient presents with acute on chronic right trapezius pain.  Suspect pain from repetitive use with muscle spasms.  Recommended exercises and heat therapy.  PCP versus orthopedic follow-up. The patient was given instructions for home care  as well as return precautions. Patient voices understanding of these instructions, accepts the plan, and is comfortable with discharge.  Final Clinical Impressions(s) / ED Diagnoses   Final diagnoses:  Chronic right shoulder pain    ED Discharge Orders    None       Concepcion LivingJoy, Shawn C, PA-C 06/05/18 1645    Loren RacerYelverton, David, MD 06/05/18 [id number]

## 2020-03-17 IMAGING — DX DG RIBS W/ CHEST 3+V*R*
4 series · 4 of 4 positions shown · non-contrast
Comparison: 10/25/2016 CXR

CLINICAL DATA: Right lower lateral rib pain since this morning.

EXAM:
RIGHT RIBS AND CHEST - 3+ VIEW

[chest pa]
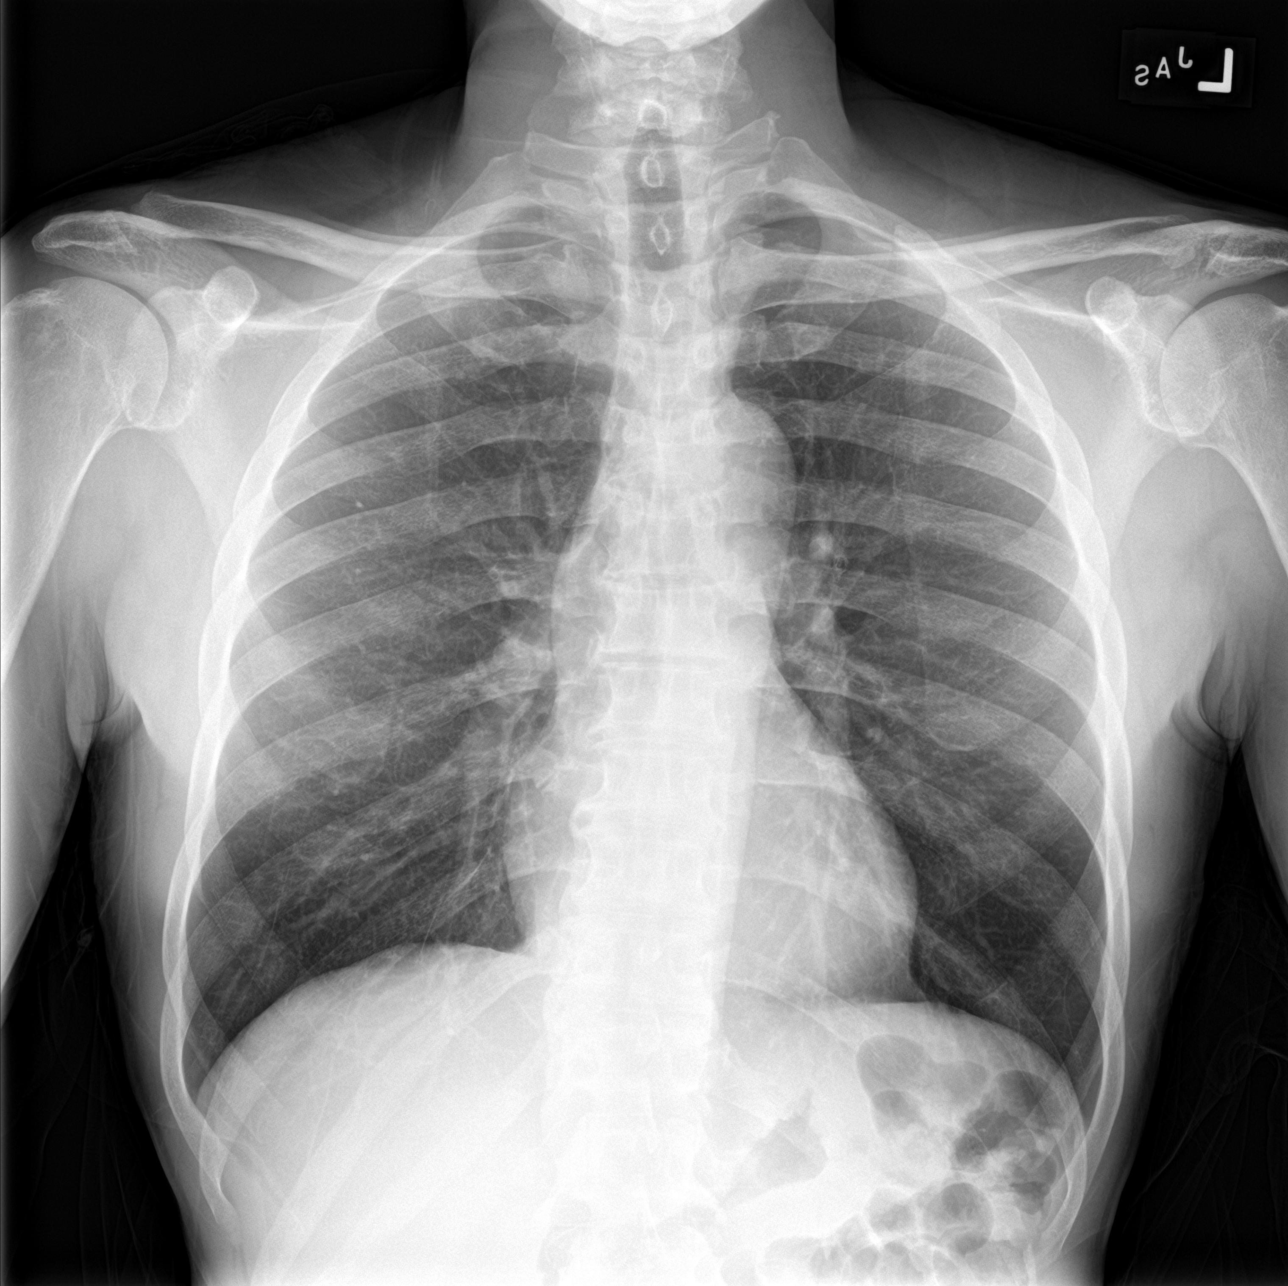

[rib pa (1 of 2)]
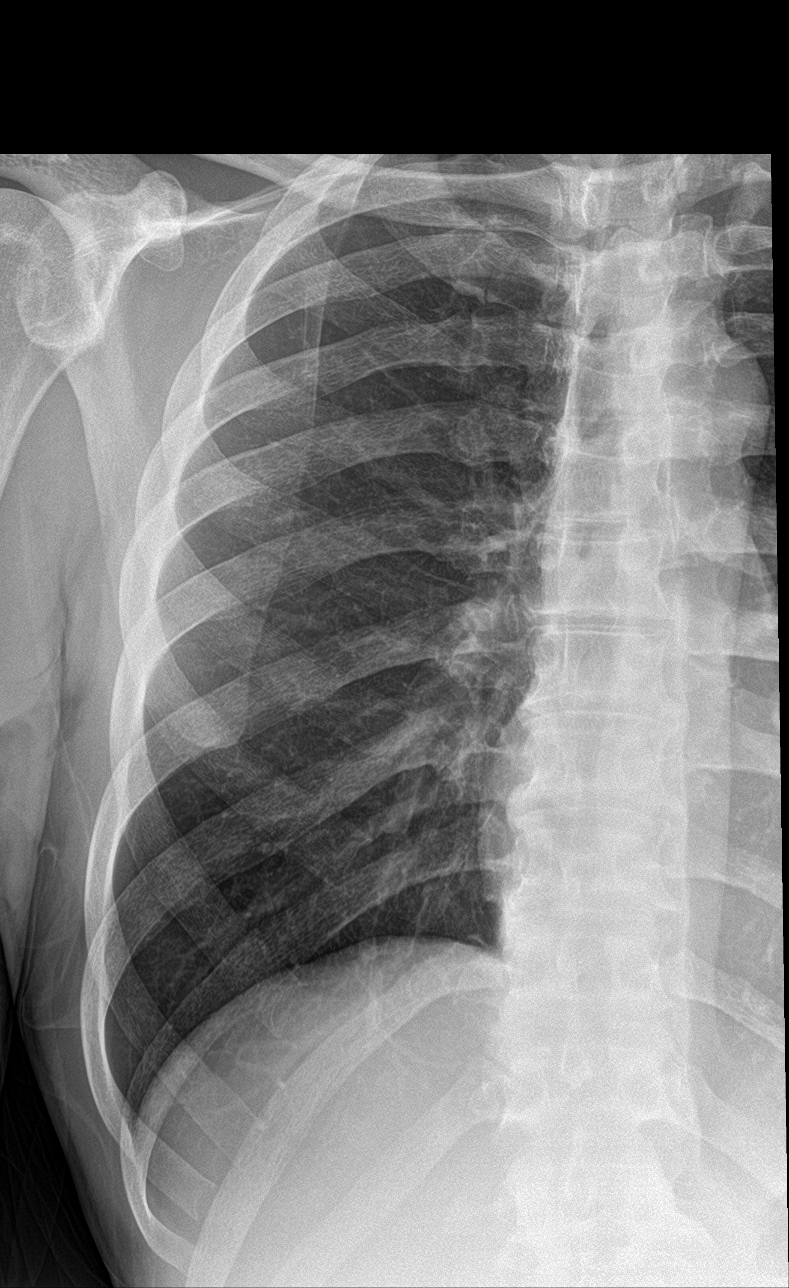

[rib pa obl]
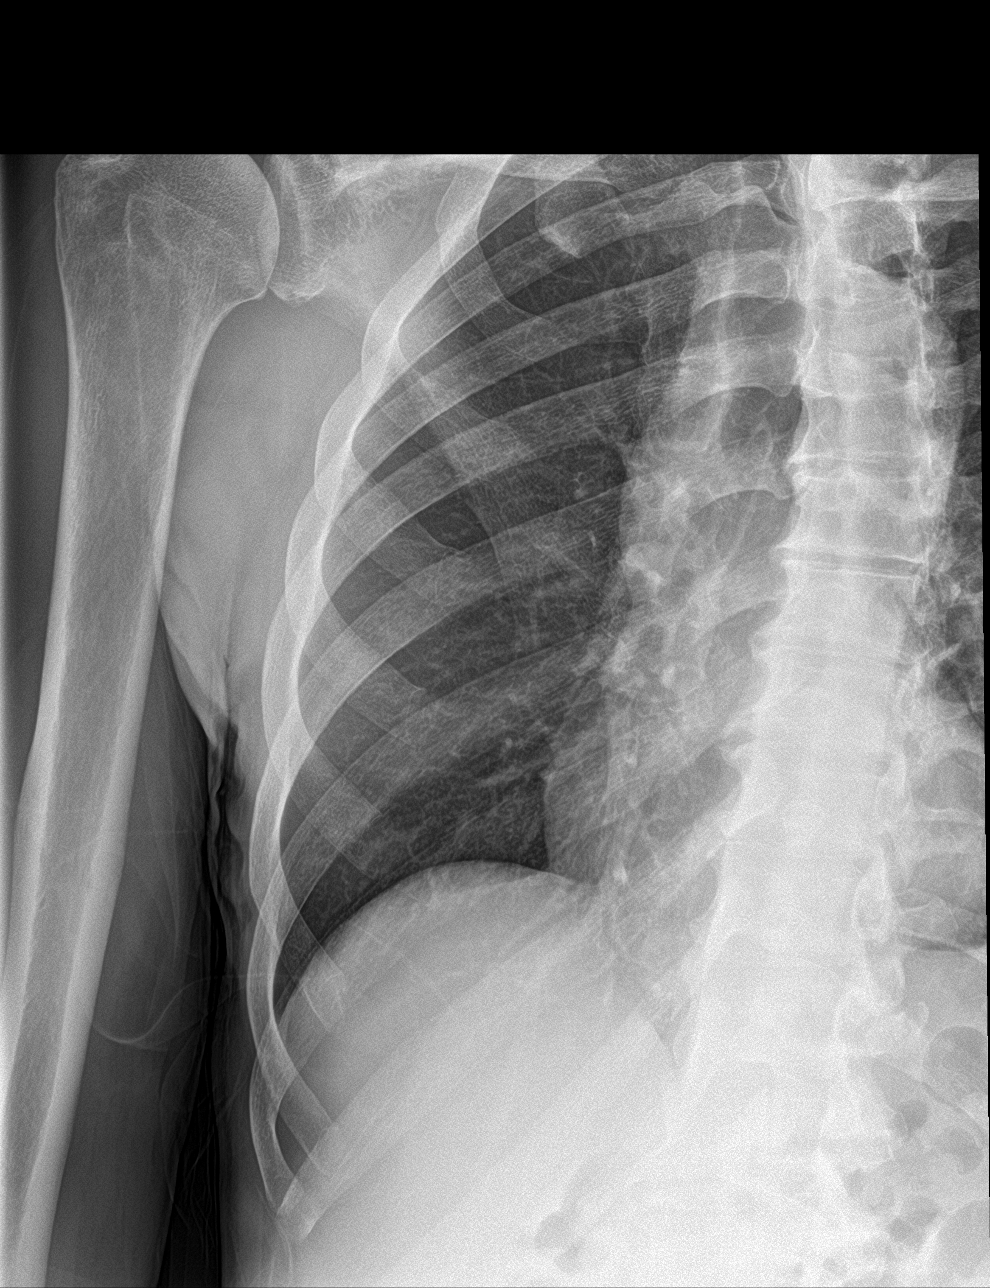

[rib pa (2 of 2)]
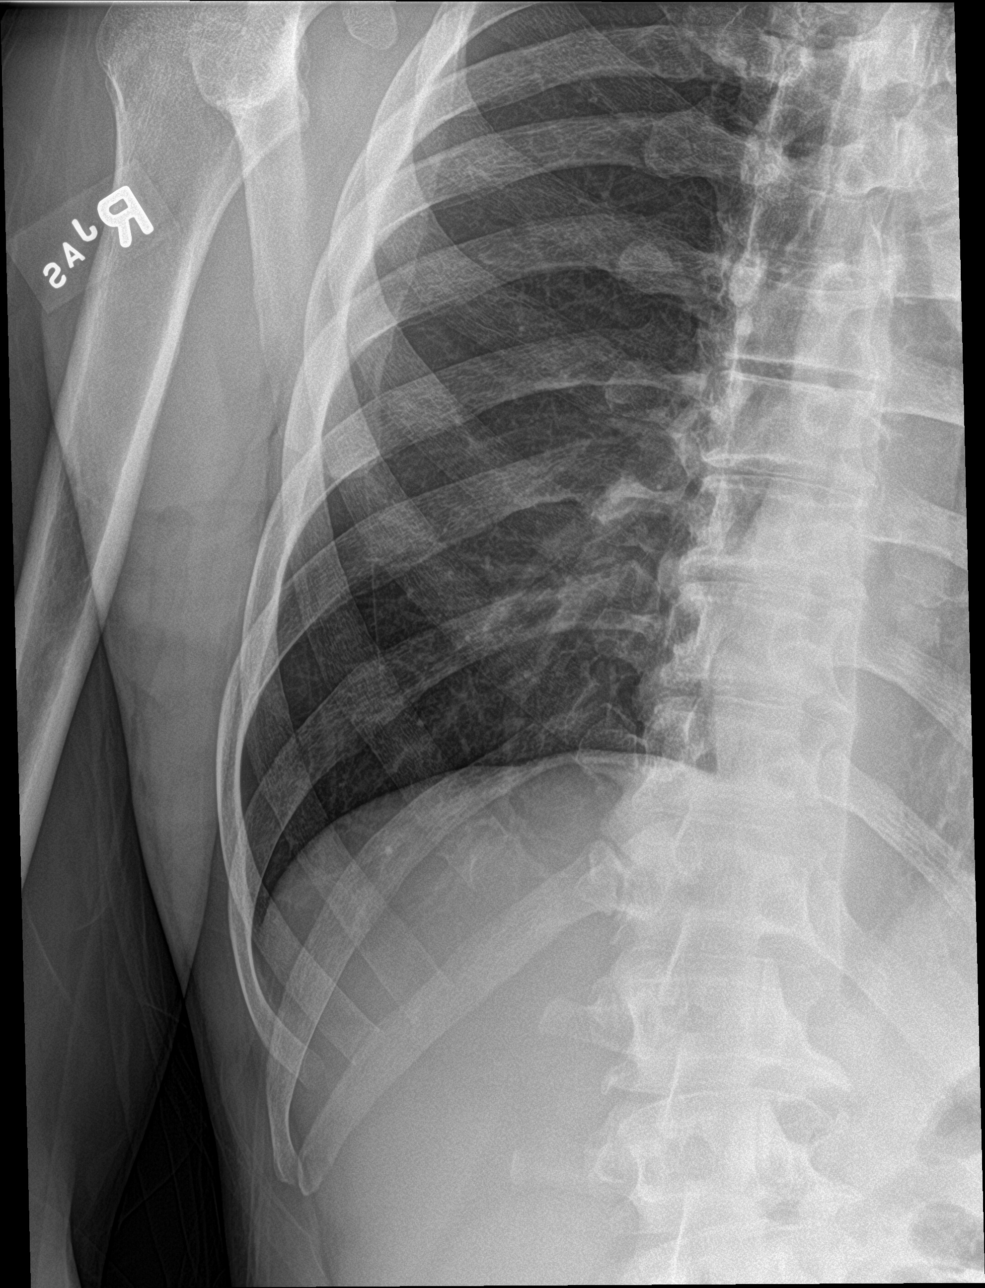

[4 of 4 positions shown; findings below may reference images not displayed]

FINDINGS: No fracture or other bone lesions are seen involving the ribs. There
is no evidence of pneumothorax or pleural effusion. Both lungs are
clear. Heart size and mediastinal contours are within normal limits.
Minimal degenerative spurring off the inferior glenoid and distal
clavicle.
IMPRESSION: No acute osseous abnormality of the right ribs.

## 2020-03-20 IMAGING — CT CT RENAL STONE PROTOCOL
2 of 4 series · 15 of 46 positions shown, 17 images · non-contrast
Comparison: None.

CLINICAL DATA: Right abdominal/flank pain for approximately 1 week.
Hematuria

EXAM:
CT ABDOMEN AND PELVIS WITHOUT CONTRAST
TECHNIQUE: Multidetector CT imaging of the abdomen and pelvis was performed
following the standard protocol without oral or IV contrast.

[Series 3: ap without · axial · non-contrast · 0.59mm/px · z∈[+695,+1070]mm · 12 of 85 slices shown, 14 images]
[im 5/85  soft-tissue]
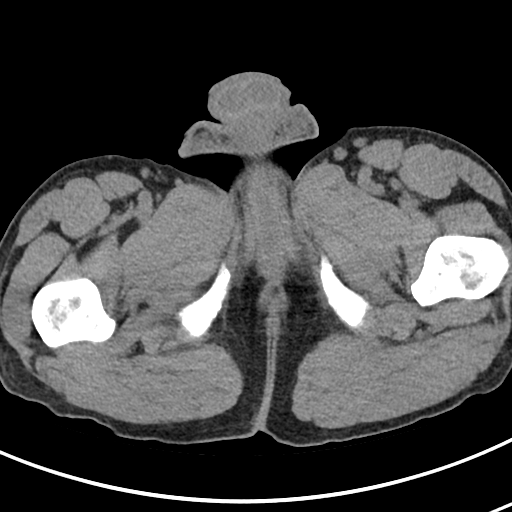
[im 5/85  bone]
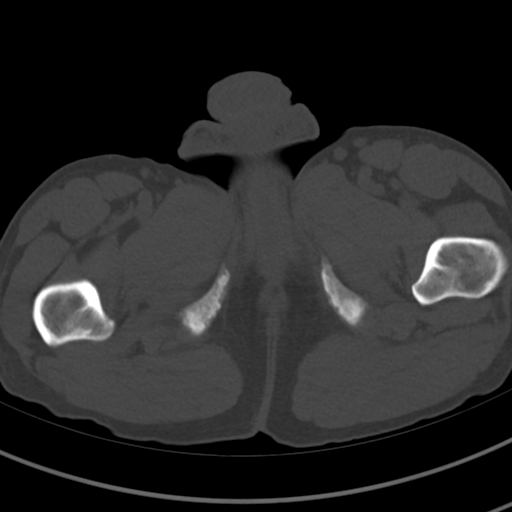
[im 14/85  soft-tissue]
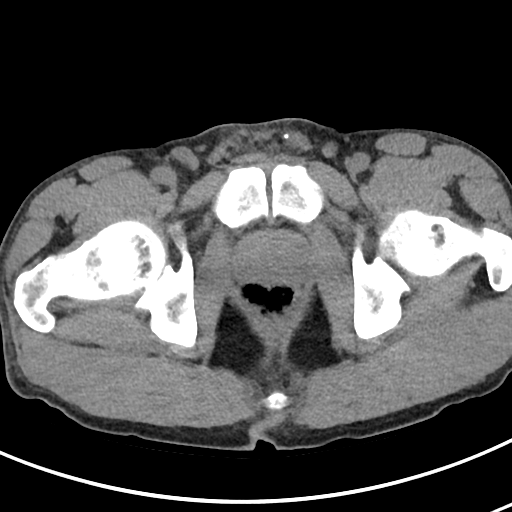
[im 18/85  soft-tissue]
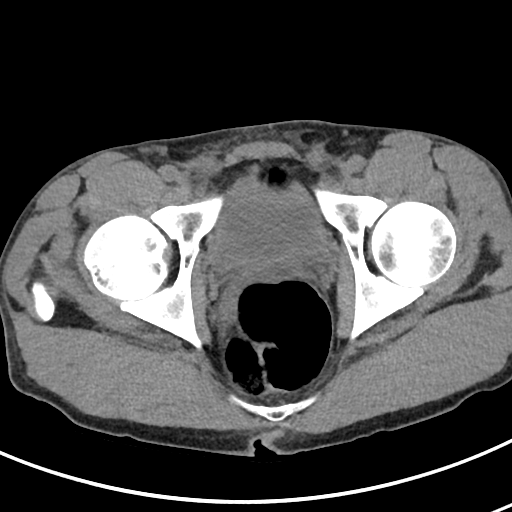
[im 27/85  soft-tissue]
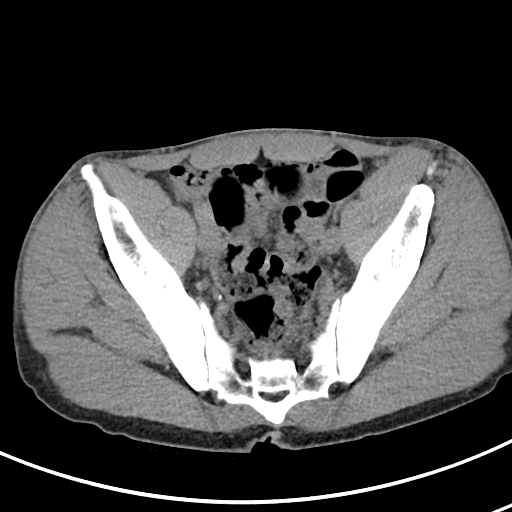
[im 31/85  soft-tissue]
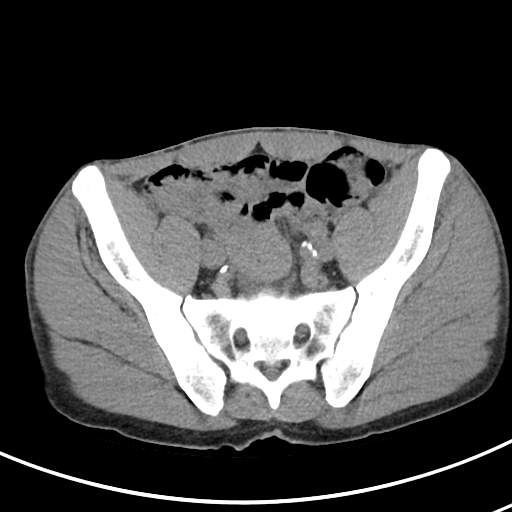
[im 40/85  soft-tissue]
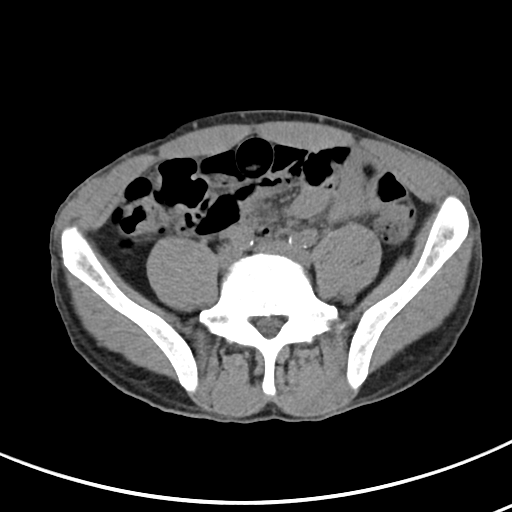
[im 45/85  soft-tissue]
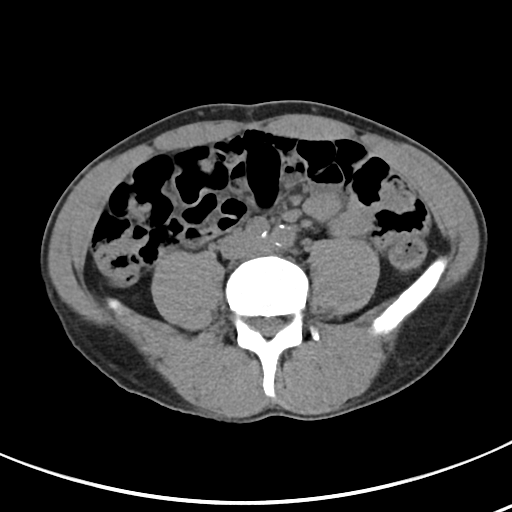
[im 54/85  soft-tissue]
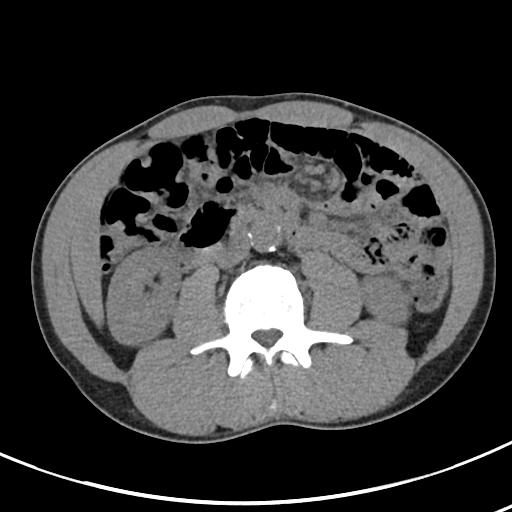
[im 58/85  soft-tissue]
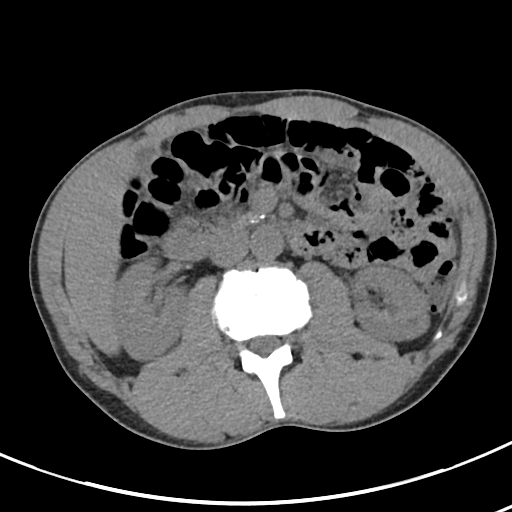
[im 58/85  bone]
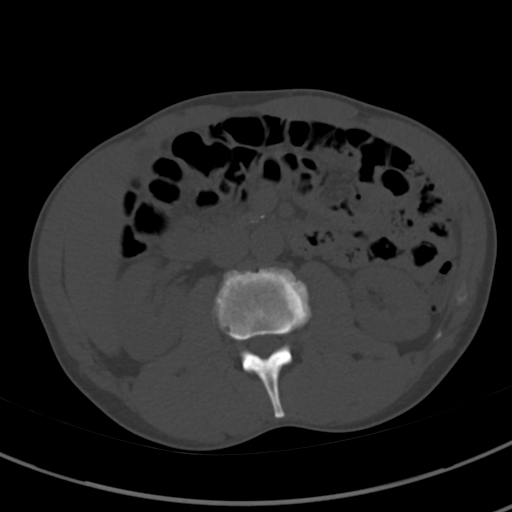
[im 67/85  soft-tissue]
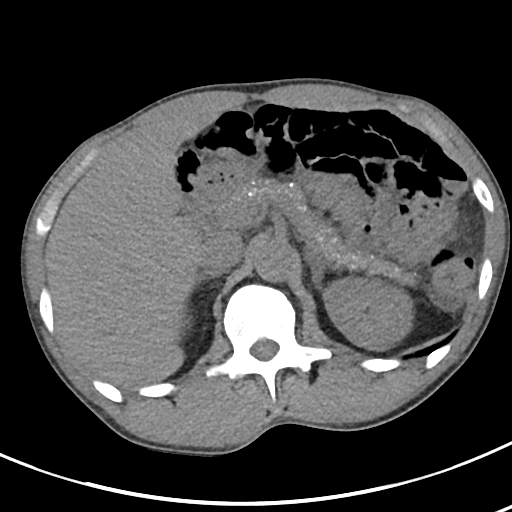
[im 71/85  soft-tissue]
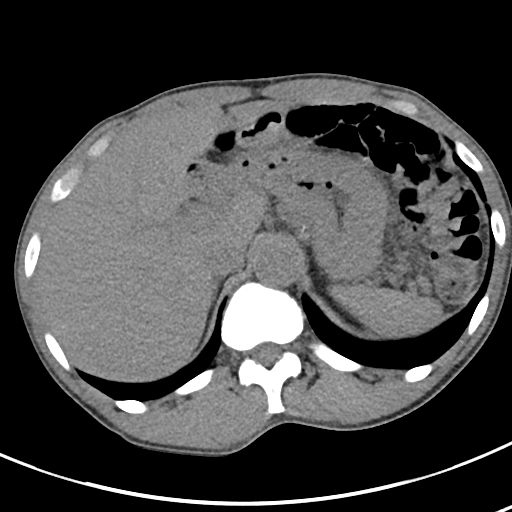
[im 80/85  soft-tissue]
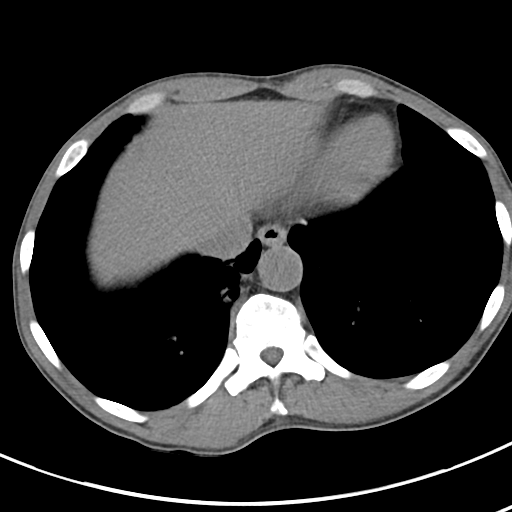

[Series 6: cor · coronal · 0.56mm/px · 3 of 79 slices shown]
[im 27/79  soft-tissue]
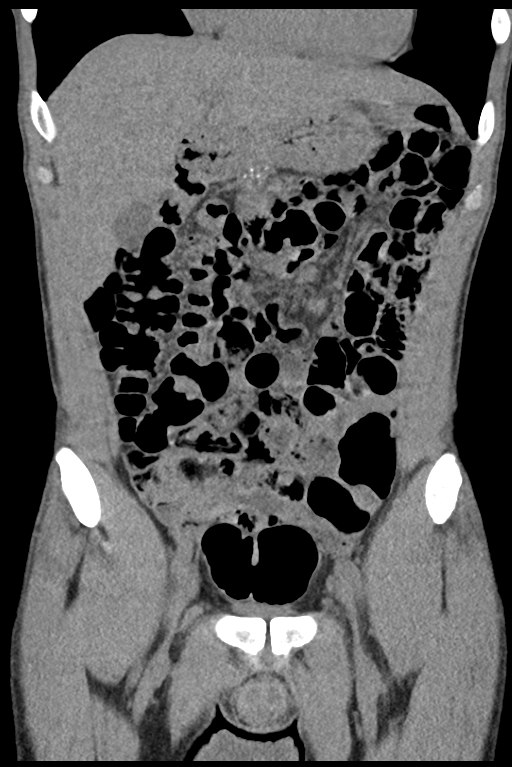
[im 35/79  soft-tissue]
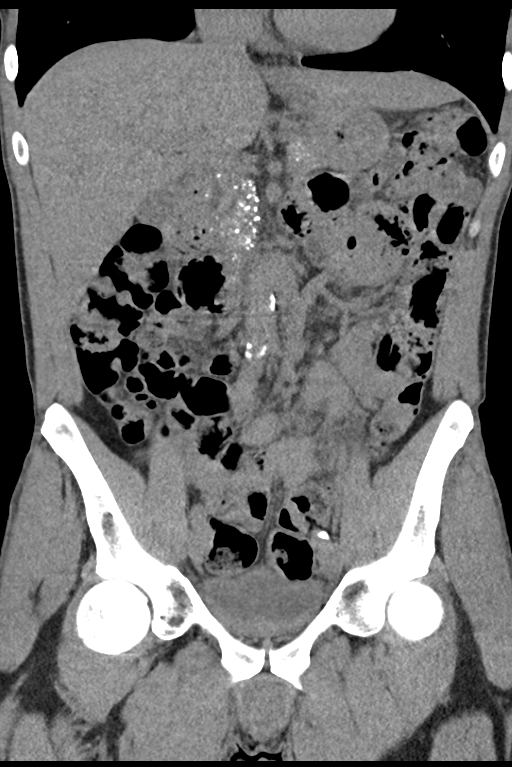
[im 44/79  soft-tissue]
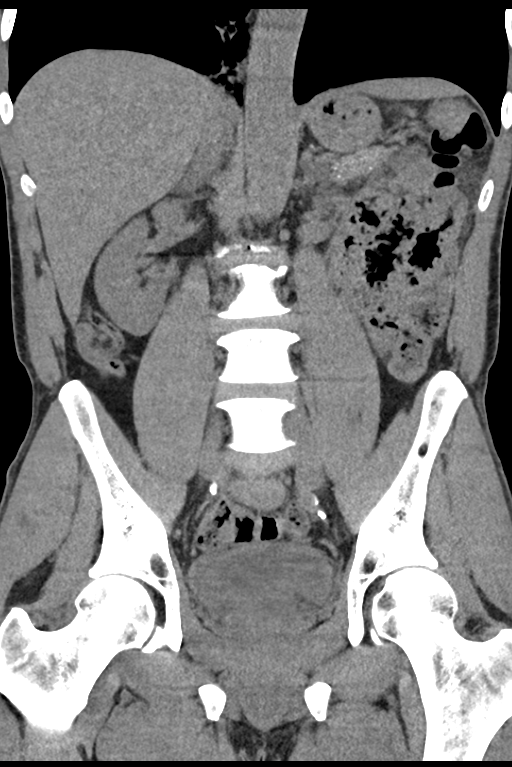

[15 of 46 positions shown; findings below may reference images not displayed]

FINDINGS: Lower chest: There is scarring in the medial segment of the right
lower lobe. There is slight lower lobe bronchiectatic change
bilaterally.

Hepatobiliary: No focal liver lesions are apparent on this
noncontrast enhanced study. Gallbladder wall is not appreciably
thickened. There is no biliary duct dilatation.

Pancreas: There is calcification throughout the pancreas diffusely
consistent with chronic pancreatitis. There is currently no
peripancreatic fluid or stranding. No pancreatic mass or duct
dilatation. No pancreatic edema.

Spleen: No splenic lesions are evident.

Adrenals/Urinary Tract: Adrenals bilaterally appear unremarkable.
Kidneys bilaterally show no evident mass or hydronephrosis on either
side. There is no appreciable renal or ureteral calculus on either
side. Urinary bladder is midline with wall thickness within normal
limits.

Stomach/Bowel: There is moderate stool in the colon. There is no
appreciable bowel wall or mesenteric thickening. No evident bowel
obstruction. No free air or portal venous air.

Vascular/Lymphatic: There is atherosclerotic calcification in the
aorta and common iliac arteries. No aneurysm evident. Major
mesenteric vessels appear patent on this noncontrast enhanced study.
No adenopathy is demonstrable in the abdomen or pelvis.

Reproductive: Prostate and seminal vesicles appear unremarkable with
respect to size and contour. No pelvic mass evident.

Other: The appendix is located slightly lateral to the inferior
aspect of the right kidney. Appendix appears unremarkable. No
abscess or ascites evident in the abdomen or pelvis.

Musculoskeletal: There are no evident blastic or lytic bone lesions.
There is no intramuscular or abdominal wall lesion.
IMPRESSION: 1. No hydronephrosis. No renal or ureteral calculus on either side.

2. Diffuse pancreatic calcification consistent with chronic
pancreatitis. No changes suggesting acute pancreatitis evident on
this study.

3. No bowel obstruction. No abscess in the abdomen pelvis. Appendix
appears normal.

4.  Aortoiliac atherosclerosis.

5. Slight lower lobe bronchiectatic change bilaterally. Scarring
medial segment right lower lobe

Aortic Atherosclerosis ([id number]).
